# Patient Record
Sex: Female | Born: 1945 | Race: White | Hispanic: No | Marital: Married | Smoking: Never smoker
Health system: Southern US, Community
[De-identification: ages and names within clinical notes are randomized; demographics above are authoritative.]

## PROBLEM LIST (undated history)

## (undated) DIAGNOSIS — T7840XA Allergy, unspecified, initial encounter: Secondary | ICD-10-CM

## (undated) DIAGNOSIS — I10 Essential (primary) hypertension: Secondary | ICD-10-CM

## (undated) DIAGNOSIS — E669 Obesity, unspecified: Secondary | ICD-10-CM

## (undated) DIAGNOSIS — H9193 Unspecified hearing loss, bilateral: Secondary | ICD-10-CM

## (undated) DIAGNOSIS — E785 Hyperlipidemia, unspecified: Secondary | ICD-10-CM

## (undated) DIAGNOSIS — F419 Anxiety disorder, unspecified: Secondary | ICD-10-CM

## (undated) HISTORY — DX: Hyperlipidemia, unspecified: E78.5

## (undated) HISTORY — DX: Allergy, unspecified, initial encounter: T78.40XA

## (undated) HISTORY — DX: Essential (primary) hypertension: I10

## (undated) HISTORY — PX: FRACTURE SURGERY: SHX138

## (undated) HISTORY — DX: Anxiety disorder, unspecified: F41.9

## (undated) HISTORY — DX: Obesity, unspecified: E66.9

## (undated) HISTORY — DX: Unspecified hearing loss, bilateral: H91.93

## (undated) HISTORY — PX: TUBAL LIGATION: SHX77

---

## 1999-08-20 ENCOUNTER — Other Ambulatory Visit: Admission: RE | Admit: 1999-08-20 | Discharge: 1999-08-20 | Payer: Self-pay | Admitting: Internal Medicine

## 2000-08-15 ENCOUNTER — Other Ambulatory Visit: Admission: RE | Admit: 2000-08-15 | Discharge: 2000-08-15 | Payer: Self-pay | Admitting: Internal Medicine

## 2000-11-24 ENCOUNTER — Encounter: Payer: Self-pay | Admitting: Internal Medicine

## 2000-11-24 ENCOUNTER — Encounter: Admission: RE | Admit: 2000-11-24 | Discharge: 2000-11-24 | Payer: Self-pay | Admitting: Internal Medicine

## 2001-10-02 ENCOUNTER — Other Ambulatory Visit: Admission: RE | Admit: 2001-10-02 | Discharge: 2001-10-02 | Payer: Self-pay | Admitting: Internal Medicine

## 2002-11-16 ENCOUNTER — Other Ambulatory Visit: Admission: RE | Admit: 2002-11-16 | Discharge: 2002-11-16 | Payer: Self-pay | Admitting: Internal Medicine

## 2002-12-04 ENCOUNTER — Encounter: Admission: RE | Admit: 2002-12-04 | Discharge: 2003-03-04 | Payer: Self-pay | Admitting: Internal Medicine

## 2003-01-01 ENCOUNTER — Other Ambulatory Visit: Admission: RE | Admit: 2003-01-01 | Discharge: 2003-01-01 | Payer: Self-pay | Admitting: Internal Medicine

## 2003-05-16 ENCOUNTER — Encounter: Admission: RE | Admit: 2003-05-16 | Discharge: 2003-08-14 | Payer: Self-pay | Admitting: Internal Medicine

## 2003-10-04 ENCOUNTER — Other Ambulatory Visit: Admission: RE | Admit: 2003-10-04 | Discharge: 2003-10-04 | Payer: Self-pay | Admitting: Internal Medicine

## 2004-08-14 ENCOUNTER — Other Ambulatory Visit: Admission: RE | Admit: 2004-08-14 | Discharge: 2004-08-14 | Payer: Self-pay | Admitting: Internal Medicine

## 2005-08-16 ENCOUNTER — Other Ambulatory Visit: Admission: RE | Admit: 2005-08-16 | Discharge: 2005-08-16 | Payer: Self-pay | Admitting: Internal Medicine

## 2006-09-01 ENCOUNTER — Other Ambulatory Visit: Admission: RE | Admit: 2006-09-01 | Discharge: 2006-09-01 | Payer: Self-pay | Admitting: Internal Medicine

## 2007-11-09 ENCOUNTER — Other Ambulatory Visit: Admission: RE | Admit: 2007-11-09 | Discharge: 2007-11-09 | Payer: Self-pay | Admitting: Internal Medicine

## 2008-05-07 ENCOUNTER — Ambulatory Visit: Payer: Self-pay | Admitting: Internal Medicine

## 2008-05-27 ENCOUNTER — Ambulatory Visit: Payer: Self-pay | Admitting: Internal Medicine

## 2008-12-30 ENCOUNTER — Ambulatory Visit: Payer: Self-pay | Admitting: Internal Medicine

## 2009-07-01 ENCOUNTER — Ambulatory Visit: Payer: Self-pay | Admitting: Internal Medicine

## 2009-07-25 ENCOUNTER — Ambulatory Visit: Payer: Self-pay | Admitting: Internal Medicine

## 2010-01-01 ENCOUNTER — Ambulatory Visit: Payer: Self-pay | Admitting: Internal Medicine

## 2010-06-30 ENCOUNTER — Ambulatory Visit: Payer: Self-pay | Admitting: Internal Medicine

## 2010-07-02 ENCOUNTER — Encounter: Payer: Self-pay | Admitting: Internal Medicine

## 2010-07-02 ENCOUNTER — Ambulatory Visit (INDEPENDENT_AMBULATORY_CARE_PROVIDER_SITE_OTHER): Payer: Self-pay | Admitting: Internal Medicine

## 2010-07-02 ENCOUNTER — Telehealth: Payer: Self-pay

## 2010-07-02 DIAGNOSIS — E119 Type 2 diabetes mellitus without complications: Secondary | ICD-10-CM

## 2010-07-02 DIAGNOSIS — L299 Pruritus, unspecified: Secondary | ICD-10-CM

## 2010-07-02 DIAGNOSIS — E785 Hyperlipidemia, unspecified: Secondary | ICD-10-CM

## 2010-07-02 LAB — HEMOGLOBIN A1C
Hgb A1c MFr Bld: 6.3 % — ABNORMAL HIGH (ref <5.7)
Mean Plasma Glucose: 134 mg/dL — ABNORMAL HIGH (ref <117)

## 2010-07-02 LAB — HEPATIC FUNCTION PANEL
ALT: 12 U/L (ref 0–35)
AST: 15 U/L (ref 0–37)
Albumin: 4.7 g/dL (ref 3.5–5.2)
Alkaline Phosphatase: 92 U/L (ref 39–117)
Bilirubin, Direct: 0.1 mg/dL (ref 0.0–0.3)
Indirect Bilirubin: 0.5 mg/dL (ref 0.0–0.9)
Total Bilirubin: 0.6 mg/dL (ref 0.3–1.2)
Total Protein: 8 g/dL (ref 6.0–8.3)

## 2010-07-02 LAB — LIPID PANEL
Cholesterol: 161 mg/dL (ref 0–200)
HDL: 69 mg/dL (ref >39)
LDL Cholesterol: 72 mg/dL (ref 0–99)
Total CHOL/HDL Ratio: 2.3 Ratio
Triglycerides: 101 mg/dL (ref <150)
VLDL: 20 mg/dL (ref 0–40)

## 2010-07-02 MED ORDER — DESOXIMETASONE 0.25 % EX CREA: TOPICAL_CREAM | Freq: Two times a day (BID) | CUTANEOUS | Status: DC

## 2010-07-02 NOTE — Patient Instructions (Signed)
Lose weight, watch diet, increase exercise. No more than 1400 calories daily

## 2010-07-02 NOTE — Progress Notes (Signed)
  Subjective:    Patient ID: Terry Wilson, female    DOB: 1945/04/19, 65 y.o.   MRN: [id number]  HPI  65 year old W female with DM, anxiety, HTN, hearing loss, hyperlipidemia, allergic rhinitis for 6 month recheck.  Eye exam due in Sept by Dr. Randon Goldsmith office.  Allergies have been worse lately.  Anxious today and BP is again elevated.  Took BP meds this am.    Review of Systems  HENT: Positive for sneezing and postnasal drip.   Eyes: Positive for itching.  Respiratory: Positive for cough. Negative for shortness of breath.   Cardiovascular: Negative for chest pain.       Objective:   Physical Exam  Constitutional: She is oriented to person, place, and time. She appears well-nourished.  HENT:  Right Ear: External ear normal.  Left Ear: External ear normal.  Eyes: Conjunctivae are normal. Pupils are equal, round, and reactive to light. Right eye exhibits no discharge. Left eye exhibits no discharge. No scleral icterus.  Neck: Neck supple. No JVD present. No thyromegaly present.  Cardiovascular: Normal rate and regular rhythm.   Murmur heard. Pulmonary/Chest: No respiratory distress. She has no wheezes. She has no rales.  Lymphadenopathy:    She has no cervical adenopathy.  Neurological: She is alert and oriented to person, place, and time. She has normal reflexes.  Skin: Skin is warm and dry.  Psychiatric: She has a normal mood and affect.   Foot exam- pulses normal and no evidence of neuropathy. No Tinea pedis.  I-II/6 SEM not new. No edema lower extremity.  Boggy nasal mucosa.        Assessment & Plan:

## 2010-07-03 MED ORDER — DESOXIMETASONE 0.25 % EX CREA: TOPICAL_CREAM | Freq: Two times a day (BID) | CUTANEOUS | Status: AC

## 2010-07-03 NOTE — Telephone Encounter (Signed)
Medication reordered per MD.

## 2010-07-03 NOTE — Telephone Encounter (Signed)
Pt stated she had the name of the cream at home.  It is a cream she used in the past for dry skin.  She stated she would call Monday to let us know.

## 2010-08-03 ENCOUNTER — Other Ambulatory Visit: Payer: Self-pay | Admitting: Internal Medicine

## 2010-09-14 ENCOUNTER — Other Ambulatory Visit: Payer: Self-pay | Admitting: Internal Medicine

## 2010-10-21 ENCOUNTER — Other Ambulatory Visit: Payer: Self-pay | Admitting: Internal Medicine

## 2010-11-25 ENCOUNTER — Other Ambulatory Visit: Payer: Self-pay | Admitting: Internal Medicine

## 2010-12-10 ENCOUNTER — Other Ambulatory Visit: Payer: Self-pay | Admitting: Internal Medicine

## 2010-12-10 DIAGNOSIS — E785 Hyperlipidemia, unspecified: Secondary | ICD-10-CM

## 2010-12-21 ENCOUNTER — Encounter: Payer: Self-pay | Admitting: Internal Medicine

## 2011-01-05 ENCOUNTER — Other Ambulatory Visit (HOSPITAL_COMMUNITY)
Admission: RE | Admit: 2011-01-05 | Discharge: 2011-01-05 | Disposition: A | Discharge status: Home / Self Care | Payer: 59 | Source: Ambulatory Visit | Attending: Internal Medicine | Admitting: Internal Medicine

## 2011-01-05 ENCOUNTER — Encounter: Payer: Self-pay | Admitting: Internal Medicine

## 2011-01-05 ENCOUNTER — Ambulatory Visit (INDEPENDENT_AMBULATORY_CARE_PROVIDER_SITE_OTHER): Payer: 59 | Admitting: Internal Medicine

## 2011-01-05 DIAGNOSIS — E119 Type 2 diabetes mellitus without complications: Secondary | ICD-10-CM

## 2011-01-05 DIAGNOSIS — Z01419 Encounter for gynecological examination (general) (routine) without abnormal findings: Secondary | ICD-10-CM | POA: Insufficient documentation

## 2011-01-05 DIAGNOSIS — H9190 Unspecified hearing loss, unspecified ear: Secondary | ICD-10-CM

## 2011-01-05 DIAGNOSIS — I10 Essential (primary) hypertension: Secondary | ICD-10-CM

## 2011-01-05 DIAGNOSIS — J309 Allergic rhinitis, unspecified: Secondary | ICD-10-CM

## 2011-01-05 DIAGNOSIS — H9193 Unspecified hearing loss, bilateral: Secondary | ICD-10-CM

## 2011-01-05 DIAGNOSIS — F419 Anxiety disorder, unspecified: Secondary | ICD-10-CM

## 2011-01-05 DIAGNOSIS — Z0000 Encounter for general adult medical examination without abnormal findings: Secondary | ICD-10-CM

## 2011-01-05 DIAGNOSIS — Z23 Encounter for immunization: Secondary | ICD-10-CM

## 2011-01-05 DIAGNOSIS — Z124 Encounter for screening for malignant neoplasm of cervix: Secondary | ICD-10-CM

## 2011-01-05 DIAGNOSIS — E785 Hyperlipidemia, unspecified: Secondary | ICD-10-CM

## 2011-01-05 DIAGNOSIS — F411 Generalized anxiety disorder: Secondary | ICD-10-CM

## 2011-01-05 LAB — COMPREHENSIVE METABOLIC PANEL
ALT: 14 U/L (ref 0–35)
AST: 14 U/L (ref 0–37)
Alkaline Phosphatase: 90 U/L (ref 39–117)
Sodium: 140 mEq/L (ref 135–145)
Total Bilirubin: 0.6 mg/dL (ref 0.3–1.2)
Total Protein: 7.3 g/dL (ref 6.0–8.3)

## 2011-01-05 LAB — POCT URINALYSIS DIPSTICK
Bilirubin, UA: NEGATIVE
Blood, UA: NEGATIVE
Glucose, UA: NEGATIVE
Ketones, UA: NEGATIVE
Spec Grav, UA: 1.01
Urobilinogen, UA: NEGATIVE

## 2011-01-05 LAB — LIPID PANEL
Cholesterol: 148 mg/dL (ref 0–200)
HDL: 66 mg/dL (ref >39)
Total CHOL/HDL Ratio: 2.2 Ratio
Triglycerides: 86 mg/dL (ref <150)

## 2011-01-05 LAB — CBC WITH DIFFERENTIAL/PLATELET
Basophils Relative: 1 % (ref 0–1)
Eosinophils Absolute: 0.2 10*3/uL (ref 0.0–0.7)
MCH: 31.2 pg (ref 26.0–34.0)
MCHC: 32.1 g/dL (ref 30.0–36.0)
Neutro Abs: 3.5 10*3/uL (ref 1.7–7.7)
Neutrophils Relative %: 53 % (ref 43–77)
Platelets: 260 10*3/uL (ref 150–400)
RBC: 4.58 MIL/uL (ref 3.87–5.11)

## 2011-01-05 LAB — HEMOGLOBIN A1C: Hgb A1c MFr Bld: 6.5 % — ABNORMAL HIGH (ref <5.7)

## 2011-01-05 LAB — TSH: TSH: 1.45 u[IU]/mL (ref 0.350–4.500)

## 2011-01-05 MED ORDER — PNEUMOCOCCAL VAC POLYVALENT 25 MCG/0.5ML IJ INJ: 0.5000 mL | INJECTION | Freq: Once | INTRAMUSCULAR | Status: DC

## 2011-01-06 LAB — VITAMIN D 25 HYDROXY (VIT D DEFICIENCY, FRACTURES): Vit D, 25-Hydroxy: 44 ng/mL (ref 30–89)

## 2011-01-15 ENCOUNTER — Other Ambulatory Visit: Payer: Self-pay | Admitting: Internal Medicine

## 2011-01-16 ENCOUNTER — Other Ambulatory Visit: Payer: Self-pay | Admitting: Internal Medicine

## 2011-01-24 DIAGNOSIS — I10 Essential (primary) hypertension: Secondary | ICD-10-CM | POA: Insufficient documentation

## 2011-01-24 DIAGNOSIS — J309 Allergic rhinitis, unspecified: Secondary | ICD-10-CM | POA: Insufficient documentation

## 2011-01-24 DIAGNOSIS — E1169 Type 2 diabetes mellitus with other specified complication: Secondary | ICD-10-CM | POA: Insufficient documentation

## 2011-01-24 DIAGNOSIS — E119 Type 2 diabetes mellitus without complications: Secondary | ICD-10-CM | POA: Insufficient documentation

## 2011-01-24 DIAGNOSIS — E785 Hyperlipidemia, unspecified: Secondary | ICD-10-CM | POA: Insufficient documentation

## 2011-01-24 DIAGNOSIS — H9193 Unspecified hearing loss, bilateral: Secondary | ICD-10-CM | POA: Insufficient documentation

## 2011-01-24 DIAGNOSIS — F419 Anxiety disorder, unspecified: Secondary | ICD-10-CM | POA: Insufficient documentation

## 2011-01-24 NOTE — Patient Instructions (Signed)
Please return to audiologist and see about getting hearing aids. Continue same regimen for diabetic control i.e. diet and exercise. Continue same antihypertensive medications. Return in 6 months for lipid panel liver functions hemoglobin A1c in office visit. You have declined to receive influenza immunization today. You were given Pneumovax immunization.

## 2011-01-24 NOTE — Progress Notes (Signed)
  Subjective:    Patient ID: Terry Wilson, female    DOB: December 05, 1945, 65 y.o.   MRN: [id number]  HPI 65 year old white female with bilateral hearing loss for evaluation of medical problems including allergic rhinitis, anxiety, hypertension, obesity, adult onset diabetes mellitus, hyperlipidemia. History of bilateral tubal ligation in the 1980s. Had mammogram earlier this year. Refuses influenza immunization. Had Pneumovax immunization today. Had bone density study 2012. Diabetic eye exam per Dr. Hazle Quant on a yearly basis  Is intolerant of a number of medications. Cannot take Naprosyn, Clinoril, Macrobid, sulfa, is allergic to tetanus toxoid. Levaquin causes diarrhea and joint swelling.  History of fractured right ankle 1991, history of fractured right shoulder 2008, cervical polyp removed 2004.  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 Is married. One son. Completed one year of college. Does not smoke or consume alcohol. Is now retired.   Family history unremarkable     Review of Systems  Constitutional: Negative.   HENT: Negative.   Eyes: Negative.   Respiratory: Negative.   Cardiovascular: Negative.   Gastrointestinal: Negative.   Genitourinary: Negative.   Musculoskeletal: Negative.   Neurological: Negative.   Hematological: Negative.   Psychiatric/Behavioral: Negative.        Objective:   Physical Exam  Vitals reviewed. Constitutional: She is oriented to person, place, and time. She appears well-developed and well-nourished.  HENT:  Head: Normocephalic and atraumatic.  Right Ear: External ear normal.  Left Ear: External ear normal.  Mouth/Throat: Oropharynx is clear and moist. No oropharyngeal exudate.       Extremely hard of hearing  Eyes: Conjunctivae and EOM are normal. Pupils are equal, round, and reactive to light. No scleral icterus.  Neck: Neck supple. No JVD present. No thyromegaly present.    Cardiovascular: Normal rate, regular rhythm, normal heart sounds and intact distal pulses.   No murmur heard. Pulmonary/Chest: Effort normal and breath sounds normal. No respiratory distress.       Breasts normal female  Abdominal: Soft. Bowel sounds are normal.  Genitourinary: Vagina normal and uterus normal.       Deferred  Musculoskeletal: She exhibits no edema.       Diabetic foot exam negative  Lymphadenopathy:    She has no cervical adenopathy.  Neurological: She is alert and oriented to person, place, and time. She has normal reflexes. No cranial nerve deficit. Coordination normal.  Skin: Skin is warm and dry.  Psychiatric: She has a normal mood and affect. Her behavior is normal. Judgment and thought content normal.    Bimanual exam normal; vagina normal         Assessment & Plan:

## 2011-02-12 ENCOUNTER — Other Ambulatory Visit: Payer: Self-pay | Admitting: Internal Medicine

## 2011-03-09 ENCOUNTER — Other Ambulatory Visit: Payer: Self-pay | Admitting: Internal Medicine

## 2011-03-19 ENCOUNTER — Telehealth: Payer: Self-pay

## 2011-03-19 MED ORDER — CEPHALEXIN 500 MG PO CAPS: 500.0000 mg | ORAL_CAPSULE | Freq: Four times a day (QID) | ORAL | Status: AC

## 2011-03-19 NOTE — Telephone Encounter (Signed)
Pt called as office was closing due to bad weather. Call in Keflex 500 mg qid x 10 days.

## 2011-03-19 NOTE — Telephone Encounter (Signed)
Patient calls today at 215, having frequent urination and burning with urination. No fever or chills, no hematuria, just discomfort. States unable to take Cipro, Levaquin or Sulfa.

## 2011-03-20 DIAGNOSIS — N390 Urinary tract infection, site not specified: Secondary | ICD-10-CM

## 2011-04-20 ENCOUNTER — Other Ambulatory Visit: Payer: Self-pay | Admitting: Internal Medicine

## 2011-05-16 ENCOUNTER — Other Ambulatory Visit: Payer: Self-pay | Admitting: Internal Medicine

## 2011-05-17 ENCOUNTER — Other Ambulatory Visit: Payer: Self-pay | Admitting: Internal Medicine

## 2011-06-10 ENCOUNTER — Other Ambulatory Visit: Payer: Self-pay | Admitting: Internal Medicine

## 2011-07-06 ENCOUNTER — Encounter: Payer: Self-pay | Admitting: Internal Medicine

## 2011-07-06 ENCOUNTER — Ambulatory Visit (INDEPENDENT_AMBULATORY_CARE_PROVIDER_SITE_OTHER): Payer: 59 | Admitting: Internal Medicine

## 2011-07-06 VITALS — BP 140/78 | HR 84 | Temp 97.7°F | Wt 207.0 lb

## 2011-07-06 DIAGNOSIS — F419 Anxiety disorder, unspecified: Secondary | ICD-10-CM

## 2011-07-06 DIAGNOSIS — Z79899 Other long term (current) drug therapy: Secondary | ICD-10-CM

## 2011-07-06 DIAGNOSIS — F341 Dysthymic disorder: Secondary | ICD-10-CM

## 2011-07-06 DIAGNOSIS — E785 Hyperlipidemia, unspecified: Secondary | ICD-10-CM

## 2011-07-06 DIAGNOSIS — H9190 Unspecified hearing loss, unspecified ear: Secondary | ICD-10-CM

## 2011-07-06 DIAGNOSIS — E119 Type 2 diabetes mellitus without complications: Secondary | ICD-10-CM

## 2011-07-06 DIAGNOSIS — F329 Major depressive disorder, single episode, unspecified: Secondary | ICD-10-CM

## 2011-07-06 DIAGNOSIS — I10 Essential (primary) hypertension: Secondary | ICD-10-CM

## 2011-07-06 DIAGNOSIS — J309 Allergic rhinitis, unspecified: Secondary | ICD-10-CM

## 2011-07-06 DIAGNOSIS — F32A Depression, unspecified: Secondary | ICD-10-CM

## 2011-07-06 LAB — HEPATIC FUNCTION PANEL
ALT: 15 U/L (ref 0–35)
AST: 18 U/L (ref 0–37)
Alkaline Phosphatase: 87 U/L (ref 39–117)
Bilirubin, Direct: 0.1 mg/dL (ref 0.0–0.3)
Indirect Bilirubin: 0.6 mg/dL (ref 0.0–0.9)

## 2011-07-06 LAB — LIPID PANEL
Cholesterol: 173 mg/dL (ref 0–200)
Total CHOL/HDL Ratio: 2.4 Ratio

## 2011-07-06 LAB — HEMOGLOBIN A1C: Hgb A1c MFr Bld: 7.2 % — ABNORMAL HIGH (ref <5.7)

## 2011-07-06 NOTE — Progress Notes (Signed)
  Subjective:    Patient ID: Terry Wilson, female    DOB: [birth year], 66 y.o.   MRN: [id number]  HPI 66 year old white female with severe bilateral hearing loss in today for six-month recheck. History of diabetes mellitus, hypertension, hyperlipidemia, allergic rhinitis, anxiety. Is becoming depressed and anxious over husband's behavior since his father died. Husband is seeing Valinda Hoar for medication treatment but refuses to see someone for counseling. This Is concerning to pt. Have Suggested She Call Valinda Hoar and Leave Her a Message. Patient says she has not been watching her diet closely. She had Pneumovax immunization November 2012. Has never had colonoscopy. 3 Hemoccult cards given today. She does have a prescription to get Zostavax vaccine if drugstore. She used to take Celexa wants to go back on it. Fasting labs are drawn today and are pending. Has seen Landry Mellow, audiologist. Hearing aids are going to call $[phone] but she certainly needs him. I had to shout at her today. This is also bothering her husband.   Review of Systems     Objective:   Physical Exam chest is clear to auscultation; cardiac exam regular rate and rhythm; extremities without edema; diabetic foot exam no ulcers and pulses are normal        Assessment & Plan:  Diabetes mellitus  Anxiety depression  Hypertension  Hyperlipidemia  Obesity  Allergic rhinitis  Severe bilateral hearing loss  Plan: Return in 6 months for physical exam. Start Celexa 20 mg one half to one tablet daily #30 with 3 refills. Advise her to complete 3 Hemoccult cards and return to this office.

## 2011-07-06 NOTE — Patient Instructions (Signed)
Start Celexa as directed. Get hearing aids. Return in 6 months for physical exam. Take better care of yourself. Watch her diet.

## 2011-07-16 ENCOUNTER — Other Ambulatory Visit: Payer: Self-pay | Admitting: Internal Medicine

## 2011-10-28 ENCOUNTER — Other Ambulatory Visit: Payer: Self-pay | Admitting: Internal Medicine

## 2011-12-13 ENCOUNTER — Other Ambulatory Visit: Payer: Self-pay | Admitting: Internal Medicine

## 2011-12-24 ENCOUNTER — Other Ambulatory Visit: Payer: Self-pay | Admitting: Internal Medicine

## 2012-01-06 ENCOUNTER — Other Ambulatory Visit: Payer: 59 | Admitting: Internal Medicine

## 2012-01-06 ENCOUNTER — Ambulatory Visit: Payer: 59 | Admitting: Internal Medicine

## 2012-01-06 ENCOUNTER — Encounter: Payer: 59 | Admitting: Internal Medicine

## 2012-02-18 ENCOUNTER — Other Ambulatory Visit: Payer: Self-pay | Admitting: Internal Medicine

## 2012-03-02 ENCOUNTER — Ambulatory Visit (INDEPENDENT_AMBULATORY_CARE_PROVIDER_SITE_OTHER): Payer: Medicare Other | Admitting: Internal Medicine

## 2012-03-02 ENCOUNTER — Other Ambulatory Visit: Payer: Medicare Other | Admitting: Internal Medicine

## 2012-03-02 ENCOUNTER — Encounter: Payer: Self-pay | Admitting: Internal Medicine

## 2012-03-02 ENCOUNTER — Other Ambulatory Visit (HOSPITAL_COMMUNITY)
Admission: RE | Admit: 2012-03-02 | Discharge: 2012-03-02 | Disposition: A | Discharge status: Home / Self Care | Payer: Medicare Other | Source: Ambulatory Visit | Attending: Internal Medicine | Admitting: Internal Medicine

## 2012-03-02 VITALS — BP 138/76 | HR 92 | Temp 98.0°F | Ht 68.0 in | Wt 209.0 lb

## 2012-03-02 DIAGNOSIS — Z124 Encounter for screening for malignant neoplasm of cervix: Secondary | ICD-10-CM | POA: Insufficient documentation

## 2012-03-02 DIAGNOSIS — E669 Obesity, unspecified: Secondary | ICD-10-CM

## 2012-03-02 DIAGNOSIS — Z79899 Other long term (current) drug therapy: Secondary | ICD-10-CM

## 2012-03-02 DIAGNOSIS — E8881 Metabolic syndrome: Secondary | ICD-10-CM

## 2012-03-02 DIAGNOSIS — F411 Generalized anxiety disorder: Secondary | ICD-10-CM

## 2012-03-02 DIAGNOSIS — E119 Type 2 diabetes mellitus without complications: Secondary | ICD-10-CM

## 2012-03-02 DIAGNOSIS — H9193 Unspecified hearing loss, bilateral: Secondary | ICD-10-CM

## 2012-03-02 DIAGNOSIS — I10 Essential (primary) hypertension: Secondary | ICD-10-CM

## 2012-03-02 DIAGNOSIS — E785 Hyperlipidemia, unspecified: Secondary | ICD-10-CM

## 2012-03-02 DIAGNOSIS — H9190 Unspecified hearing loss, unspecified ear: Secondary | ICD-10-CM

## 2012-03-02 LAB — POCT URINALYSIS DIPSTICK
Ketones, UA: NEGATIVE
Leukocytes, UA: NEGATIVE
Protein, UA: NEGATIVE
pH, UA: 7

## 2012-03-02 LAB — CBC WITH DIFFERENTIAL/PLATELET
Basophils Absolute: 0 10*3/uL (ref 0.0–0.1)
HCT: 41.1 % (ref 36.0–46.0)
Lymphocytes Relative: 35 % (ref 12–46)
Neutro Abs: 3 10*3/uL (ref 1.7–7.7)
Platelets: 270 10*3/uL (ref 150–400)
RDW: 12.9 % (ref 11.5–15.5)
WBC: 5.6 10*3/uL (ref 4.0–10.5)

## 2012-03-02 LAB — HEMOGLOBIN A1C
Hgb A1c MFr Bld: 6.8 % — ABNORMAL HIGH (ref <5.7)
Mean Plasma Glucose: 148 mg/dL — ABNORMAL HIGH (ref <117)

## 2012-03-02 LAB — COMPREHENSIVE METABOLIC PANEL
ALT: 10 U/L (ref 0–35)
AST: 13 U/L (ref 0–37)
Albumin: 4.4 g/dL (ref 3.5–5.2)
Calcium: 9.9 mg/dL (ref 8.4–10.5)
Chloride: 100 mEq/L (ref 96–112)
Potassium: 4.1 mEq/L (ref 3.5–5.3)

## 2012-03-02 LAB — VITAMIN D 25 HYDROXY (VIT D DEFICIENCY, FRACTURES): Vit D, 25-Hydroxy: 35 ng/mL (ref 30–89)

## 2012-03-03 ENCOUNTER — Telehealth: Payer: Self-pay | Admitting: Internal Medicine

## 2012-03-03 NOTE — Telephone Encounter (Signed)
Bonita Quin called these in.

## 2012-05-20 ENCOUNTER — Other Ambulatory Visit: Payer: Self-pay | Admitting: Internal Medicine

## 2012-08-04 ENCOUNTER — Other Ambulatory Visit: Payer: Self-pay | Admitting: Internal Medicine

## 2012-08-26 ENCOUNTER — Encounter: Payer: Self-pay | Admitting: Internal Medicine

## 2012-08-26 DIAGNOSIS — E8881 Metabolic syndrome: Secondary | ICD-10-CM | POA: Insufficient documentation

## 2012-08-26 NOTE — Progress Notes (Signed)
Subjective:    Patient ID: Terry Wilson, female    DOB: [birth year], 67 y.o.   MRN: [id number]  HPI   67 year old white Female in today for Welcome to Medicare physical exam and evaluation of medical problems. History of allergic rhinitis, anxiety, hypertension, obesity, controlled type 2 diabetes mellitus, hyperlipidemia.  Refuses influenza immunization.  Pneumovax immunization given 2012.  Has diabetic eye exam by Dr. Hazle Quant in September yearly  Past medical history: History bilateral tubal ligation in the 1980s. Fractured right ankle 1991. History of fractured right shoulder 2008. Cervical polyp removed 2004.  Intolerant of a number of medications. Cannot take Naprosyn, Clinoril, Macrobid. Cannot take Sulfa. Is allergic to tetanus toxoid. Levaquin causes diarrhea and joint swelling.  Social history: Patient is married. She completed one year of college. Does not smoke or consume alcohol. Is now retired. 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  History bilateral hearing loss and now has bilateral hearing aids.  Family history: 2 sisters in good health. One son with history of alcoholism and diabetes mellitus.    Review of Systems  Constitutional: Positive for fatigue.  HENT:       History bilateral hearing loss  Eyes: Negative.   Respiratory: Negative.   Cardiovascular: Negative.   Gastrointestinal: Negative.   Endocrine: Negative.   Genitourinary: Negative.   Allergic/Immunologic: Positive for environmental allergies.  Neurological: Negative.   Psychiatric/Behavioral:       Anxiety       Objective:   Physical Exam  Vitals reviewed. Constitutional: She is oriented to person, place, and time. She appears well-developed and well-nourished. No distress.  HENT:  Head: Normocephalic and atraumatic.  Right Ear: External ear normal.  Left Ear: External ear normal.  Mouth/Throat: Oropharynx is clear and moist.  No oropharyngeal exudate.  Eyes: Conjunctivae and EOM are normal. Pupils are equal, round, and reactive to light. Right eye exhibits no discharge. Left eye exhibits no discharge. No scleral icterus.  Neck: Neck supple. No JVD present. No thyromegaly present.  Cardiovascular: Normal rate, regular rhythm, normal heart sounds and intact distal pulses.   No murmur heard. Pulmonary/Chest: Effort normal and breath sounds normal. No respiratory distress. She has no wheezes. She has no rales.  Breasts normal female  Abdominal: Soft. Bowel sounds are normal. She exhibits no distension and no mass. There is no tenderness. There is no rebound and no guarding.  Genitourinary: Vagina normal and uterus normal.  Pap taken  Musculoskeletal: Normal range of motion. She exhibits no edema.  Lymphadenopathy:    She has no cervical adenopathy.  Neurological: She is alert and oriented to person, place, and time. She has normal reflexes. She displays normal reflexes. No cranial nerve deficit.  Skin: Skin is warm and dry. No rash noted. She is not diaphoretic.  Psychiatric: She has a normal mood and affect. Her behavior is normal. Judgment and thought content normal.          Assessment & Plan:  Hypertension-controlled  Hyperlipidemia-controlled  Type 2 diabetes mellitus-controlled  Obesity-status diet exercise and weight loss  Metabolic syndrome  Bilateral hearing loss-now has hearing aids  Anxiety treated with Klonopin  Allergic rhinitis treated with Allegra and Flonase as well as Patanol ophthalmic drops  Plan: Return in 6 months for office visit panel liver functions hemoglobin A1c. Reminded about yearly eye exam with Dr. Hazle Quant. Needs annual mammogram. Refuses annual influenza immunization..   Subjective:   Patient presents for Medicare  Annual/Subsequent preventive examination.   Review Past Medical/Family/Social: See above   Risk Factors  Current exercise habits: Sedentary Dietary  issues discussed:  Low-fat low-carb diet.  Cardiac risk factors:  Depression Screen  (Note: if answer to either of the following is "Yes", a more complete depression screening is indicated)   Over the past two weeks, have you felt down, depressed or hopeless? No  Over the past two weeks, have you felt little interest or pleasure in doing things? No Have you lost interest or pleasure in daily life? No Do you often feel hopeless? No Do you cry easily over simple problems? No   Activities of Daily Living  In your present state of health, do you have any difficulty performing the following activities?:   Driving? No  Managing money? No  Feeding yourself? No  Getting from bed to chair? No  Climbing a flight of stairs? No  Preparing food and eating?: No  Bathing or showering? No  Getting dressed: No  Getting to the toilet? No  Using the toilet:No  Moving around from place to place: No  In the past year have you fallen or had a near fall?:No  Are you sexually active? seldom Do you have more than one partner? No   Hearing Difficulties: No  Do you often ask people to speak up or repeat themselves? Yes without hearing aids Do you experience ringing or noises in your ears? No  Do you have difficulty understanding soft or whispered voices? Yes without hearing aids Do you feel that you have a problem with memory? No Do you often misplace items? No    Home Safety:  Do you have a smoke alarm at your residence? Yes Do you have grab bars in the bathroom? Do you have throw rugs in your house?   Cognitive Testing  Alert? Yes Normal Appearance?Yes  Oriented to person? Yes Place? Yes  Time? Yes  Recall of three objects? Yes  Can perform simple calculations? Yes  Displays appropriate judgment?Yes  Can read the correct time from a watch face?Yes   List the Names of Other Physician/Practitioners you currently use:  See referral list for the physicians patient is currently seeing. Dr.  Hazle Quant    Review of Systems: See above   Objective:     General appearance: Appears stated age and mildly obese  Head: Normocephalic, without obvious abnormality, atraumatic  Eyes: conj clear, EOMi PEERLA  Ears: normal TM's and external ear canals both ears  Nose: Nares normal. Septum midline. Mucosa normal. No drainage or sinus tenderness.  Throat: lips, mucosa, and tongue normal; teeth and gums normal  Neck: no adenopathy, no carotid bruit, no JVD, supple, symmetrical, trachea midline and thyroid not enlarged, symmetric, no tenderness/mass/nodules  No CVA tenderness.  Lungs: clear to auscultation bilaterally  Breasts: normal appearance, no masses or tenderness. Heart: regular rate and rhythm, S1, S2 normal, no murmur, click, rub or gallop  Abdomen: soft, non-tender; bowel sounds normal; no masses, no organomegaly  Musculoskeletal: ROM normal in all joints, no crepitus, no deformity, Normal muscle strengthen. Back  is symmetric, no curvature. Skin: Skin color, texture, turgor normal. No rashes or lesions  Lymph nodes: Cervical, supraclavicular, and axillary nodes normal.  Neurologic: CN 2 -12 Normal, Normal symmetric reflexes. Normal coordination and gait  Psych: Alert & Oriented x 3, Mood appear stable.    Assessment:    Annual wellness medicare exam   Plan:    During the course of the visit the patient  was educated and counseled about appropriate screening and preventive services including:   Annual mammogram  Refuses influenza immunization  Annual diabetic eye exam     Patient Instructions (the written plan) was given to the patient.  Medicare Attestation  I have personally reviewed:  The patient's medical and social history  Their use of alcohol, tobacco or illicit drugs  Their current medications and supplements  The patient's functional ability including ADLs,fall risks, home safety risks, cognitive, and hearing and visual impairment  Diet and physical  activities  Evidence for depression or mood disorders  The patient's weight, height, BMI, and visual acuity have been recorded in the chart. I have made referrals, counseling, and provided education to the patient based on review of the above and I have provided the patient with a written personalized care plan for preventive services.

## 2012-08-26 NOTE — Patient Instructions (Addendum)
Continue same meds and return in  July.

## 2012-08-31 ENCOUNTER — Ambulatory Visit (INDEPENDENT_AMBULATORY_CARE_PROVIDER_SITE_OTHER): Payer: Medicare Other | Admitting: Internal Medicine

## 2012-08-31 ENCOUNTER — Encounter: Payer: Self-pay | Admitting: Internal Medicine

## 2012-08-31 VITALS — BP 130/80 | HR 80 | Temp 97.5°F | Wt 214.0 lb

## 2012-08-31 DIAGNOSIS — Z23 Encounter for immunization: Secondary | ICD-10-CM

## 2012-08-31 DIAGNOSIS — Z79899 Other long term (current) drug therapy: Secondary | ICD-10-CM

## 2012-08-31 DIAGNOSIS — E785 Hyperlipidemia, unspecified: Secondary | ICD-10-CM

## 2012-08-31 DIAGNOSIS — E119 Type 2 diabetes mellitus without complications: Secondary | ICD-10-CM

## 2012-08-31 LAB — HEPATIC FUNCTION PANEL
Alkaline Phosphatase: 93 U/L (ref 39–117)
Bilirubin, Direct: 0.1 mg/dL (ref 0.0–0.3)
Indirect Bilirubin: 0.4 mg/dL (ref 0.0–0.9)
Total Bilirubin: 0.5 mg/dL (ref 0.3–1.2)

## 2012-08-31 LAB — LIPID PANEL: LDL Cholesterol: 79 mg/dL (ref 0–99)

## 2012-08-31 MED ORDER — MELOXICAM 15 MG PO TABS: 15.0000 mg | ORAL_TABLET | Freq: Every day | ORAL | Status: DC

## 2012-08-31 MED ORDER — TERCONAZOLE 0.4 % VA CREA: TOPICAL_CREAM | VAGINAL | Status: DC

## 2012-08-31 MED ORDER — PIRBUTEROL ACETATE 200 MCG/INH IN AERB: INHALATION_SPRAY | RESPIRATORY_TRACT | Status: DC

## 2012-08-31 MED ORDER — ALBUTEROL SULFATE HFA 108 (90 BASE) MCG/ACT IN AERS: 2.0000 | INHALATION_SPRAY | Freq: Four times a day (QID) | RESPIRATORY_TRACT | Status: DC | PRN

## 2012-08-31 MED ORDER — CLONAZEPAM 0.5 MG PO TABS: 0.5000 mg | ORAL_TABLET | Freq: Two times a day (BID) | ORAL | Status: DC | PRN

## 2012-08-31 MED ORDER — PNEUMOCOCCAL VAC POLYVALENT 25 MCG/0.5ML IJ INJ: 0.5000 mL | INJECTION | INTRAMUSCULAR | Status: AC

## 2012-08-31 NOTE — Addendum Note (Signed)
Addended by: Judy Pimple on: 08/31/2012 12:44 PM   Modules accepted: Orders

## 2012-08-31 NOTE — Addendum Note (Signed)
Addended by: Judy Pimple on: 08/31/2012 11:58 AM   Modules accepted: Orders

## 2012-08-31 NOTE — Progress Notes (Signed)
  Subjective:    Patient ID: Terry Wilson, female    DOB: Oct 12, 1945, 67 y.o.   MRN: [id number]  HPI   67 year old white female with history of hypertension, anxiety, diabetes, allergic rhinitis in today for six-month recheck.  Family history: Father living at age 42 but unable to walk because of severe arthritis. Mother died with Picks disease. 2 sisters in good health.  Reminded about diabetic eye exam. Pneumovax update given today since she is now over 66.  Son is an alcoholic and has had some relapses recently. This is been stressful. Patient is retired.  Patient has never had a colonoscopy. She has declined this study so far.  Says she needs refill Maxair inhaler which is no longer made apparently. Drugstore may substitute another inhaler. Also at her request refill Terazol vaginal cream and Klonopin.  Has history of hearing loss    Review of Systems     Objective:   Physical Exam skin is warm and dry. Nodes none. Neck is supple without JVD thyromegaly or carotid bruits. Chest clear to auscultation. Cardiac exam regular rate and rhythm normal S1 and S2. Extremities without edema. Pulses are normal in feet. No diabetic foot lesions.        Assessment & Plan:  Anxiety-takes when necessary Klonopin sparingly  Type 2 diabetes mellitus-hemoglobin A1c pending  Hypertension-blood pressure under good control on current regimen  Allergic rhinitis-refill inhaler  Plan: Return in 6 months for physical examination.  Spent 25 minutes with patient about health issues, issues with husband and son

## 2012-08-31 NOTE — Patient Instructions (Addendum)
Continue same medications and return in 6 months 

## 2012-09-18 ENCOUNTER — Telehealth: Payer: Self-pay | Admitting: Internal Medicine

## 2012-09-18 NOTE — Telephone Encounter (Signed)
Called to say that her adult son was taken to [facility], admitted overnight and transferred to treatment facility in [facility] for substance abuse. He should be there 3-5 days for detox. Then  a program called [facility] in [place] has been recommended for him. Apparently he resumed drinking again recently and she was unaware of it. I have recommended Al-Anon for her.

## 2012-10-05 ENCOUNTER — Telehealth: Payer: Self-pay | Admitting: Internal Medicine

## 2012-10-05 NOTE — Telephone Encounter (Signed)
Left message on patient's cell [phone] advising that she would need to be seen or go to an Urgent Care r/t Dr. Beryle Quant instructions below.  No recent UTI on file.  Instructed patient to call me back if she wishes to make an appointment with [place].

## 2012-10-05 NOTE — Telephone Encounter (Signed)
She needs to be seen either here or at Urgent care. I have no record of a recent UTI.

## 2012-12-01 ENCOUNTER — Other Ambulatory Visit: Payer: Self-pay | Admitting: Internal Medicine

## 2013-03-02 ENCOUNTER — Other Ambulatory Visit: Payer: Self-pay | Admitting: Internal Medicine

## 2013-03-13 ENCOUNTER — Encounter: Payer: Self-pay | Admitting: Internal Medicine

## 2013-03-29 ENCOUNTER — Other Ambulatory Visit: Payer: Self-pay | Admitting: Internal Medicine

## 2013-03-30 NOTE — Telephone Encounter (Signed)
Refill x 6 months 

## 2013-04-21 ENCOUNTER — Other Ambulatory Visit: Payer: Self-pay | Admitting: Internal Medicine

## 2013-05-03 ENCOUNTER — Ambulatory Visit (INDEPENDENT_AMBULATORY_CARE_PROVIDER_SITE_OTHER): Payer: Medicare Other | Admitting: Internal Medicine

## 2013-05-03 ENCOUNTER — Other Ambulatory Visit: Payer: Medicare Other | Admitting: Internal Medicine

## 2013-05-03 ENCOUNTER — Encounter: Payer: Self-pay | Admitting: Internal Medicine

## 2013-05-03 VITALS — BP 146/68 | HR 88 | Temp 98.8°F | Ht 67.0 in | Wt 221.0 lb

## 2013-05-03 DIAGNOSIS — E669 Obesity, unspecified: Secondary | ICD-10-CM

## 2013-05-03 DIAGNOSIS — F4321 Adjustment disorder with depressed mood: Secondary | ICD-10-CM

## 2013-05-03 DIAGNOSIS — Z130 Encounter for screening for diseases of the blood and blood-forming organs and certain disorders involving the immune mechanism: Secondary | ICD-10-CM

## 2013-05-03 DIAGNOSIS — I10 Essential (primary) hypertension: Secondary | ICD-10-CM

## 2013-05-03 DIAGNOSIS — Z0000 Encounter for general adult medical examination without abnormal findings: Secondary | ICD-10-CM

## 2013-05-03 DIAGNOSIS — J309 Allergic rhinitis, unspecified: Secondary | ICD-10-CM

## 2013-05-03 DIAGNOSIS — E785 Hyperlipidemia, unspecified: Secondary | ICD-10-CM

## 2013-05-03 DIAGNOSIS — E119 Type 2 diabetes mellitus without complications: Secondary | ICD-10-CM

## 2013-05-03 DIAGNOSIS — Z79899 Other long term (current) drug therapy: Secondary | ICD-10-CM

## 2013-05-03 DIAGNOSIS — H9190 Unspecified hearing loss, unspecified ear: Secondary | ICD-10-CM

## 2013-05-03 DIAGNOSIS — Z13228 Encounter for screening for other metabolic disorders: Secondary | ICD-10-CM

## 2013-05-03 DIAGNOSIS — F4320 Adjustment disorder, unspecified: Secondary | ICD-10-CM

## 2013-05-03 DIAGNOSIS — H9193 Unspecified hearing loss, bilateral: Secondary | ICD-10-CM

## 2013-05-03 DIAGNOSIS — Z1329 Encounter for screening for other suspected endocrine disorder: Secondary | ICD-10-CM

## 2013-05-03 LAB — CBC WITH DIFFERENTIAL/PLATELET
BASOS ABS: 0.1 10*3/uL (ref 0.0–0.1)
BASOS PCT: 1 % (ref 0–1)
EOS ABS: 0.1 10*3/uL (ref 0.0–0.7)
EOS PCT: 2 % (ref 0–5)
HEMATOCRIT: 42.1 % (ref 36.0–46.0)
HEMOGLOBIN: 14.1 g/dL (ref 12.0–15.0)
Lymphocytes Relative: 37 % (ref 12–46)
Lymphs Abs: 2.4 10*3/uL (ref 0.7–4.0)
MCH: 30.5 pg (ref 26.0–34.0)
MCHC: 33.5 g/dL (ref 30.0–36.0)
MCV: 91.1 fL (ref 78.0–100.0)
MONO ABS: 0.5 10*3/uL (ref 0.1–1.0)
MONOS PCT: 8 % (ref 3–12)
NEUTROS ABS: 3.4 10*3/uL (ref 1.7–7.7)
Neutrophils Relative %: 52 % (ref 43–77)
Platelets: 287 10*3/uL (ref 150–400)
RBC: 4.62 MIL/uL (ref 3.87–5.11)
RDW: 13.4 % (ref 11.5–15.5)
WBC: 6.5 10*3/uL (ref 4.0–10.5)

## 2013-05-03 LAB — COMPREHENSIVE METABOLIC PANEL
ALBUMIN: 4.1 g/dL (ref 3.5–5.2)
ALK PHOS: 93 U/L (ref 39–117)
ALT: 15 U/L (ref 0–35)
AST: 14 U/L (ref 0–37)
BILIRUBIN TOTAL: 0.5 mg/dL (ref 0.2–1.2)
BUN: 15 mg/dL (ref 6–23)
CO2: 32 mEq/L (ref 19–32)
CREATININE: 0.62 mg/dL (ref 0.50–1.10)
Calcium: 9.7 mg/dL (ref 8.4–10.5)
Chloride: 98 mEq/L (ref 96–112)
GLUCOSE: 135 mg/dL — AB (ref 70–99)
POTASSIUM: 4.1 meq/L (ref 3.5–5.3)
Sodium: 136 mEq/L (ref 135–145)
Total Protein: 7.2 g/dL (ref 6.0–8.3)

## 2013-05-03 LAB — POCT URINALYSIS DIPSTICK
BILIRUBIN UA: NEGATIVE
Glucose, UA: NEGATIVE
KETONES UA: NEGATIVE
LEUKOCYTES UA: NEGATIVE
NITRITE UA: NEGATIVE
PH UA: 6.5
PROTEIN UA: NEGATIVE
RBC UA: NEGATIVE
Spec Grav, UA: 1.01
Urobilinogen, UA: NEGATIVE

## 2013-05-03 LAB — LIPID PANEL
CHOL/HDL RATIO: 2.9 ratio
Cholesterol: 190 mg/dL (ref 0–200)
HDL: 65 mg/dL (ref >39)
LDL CALC: 108 mg/dL — AB (ref 0–99)
TRIGLYCERIDES: 83 mg/dL (ref <150)
VLDL: 17 mg/dL (ref 0–40)

## 2013-05-03 LAB — HEMOGLOBIN A1C
Hgb A1c MFr Bld: 7.1 % — ABNORMAL HIGH (ref <5.7)
Mean Plasma Glucose: 157 mg/dL — ABNORMAL HIGH (ref <117)

## 2013-05-03 MED ORDER — METHYLPREDNISOLONE ACETATE 80 MG/ML IJ SUSP: 80.0000 mg | Freq: Once | INTRAMUSCULAR | Status: DC

## 2013-05-03 MED ORDER — METHYLPREDNISOLONE ACETATE 80 MG/ML IJ SUSP
80.0000 mg | Freq: Once | INTRAMUSCULAR | Status: AC
  Administered 2013-05-03: 80 mg via INTRAMUSCULAR

## 2013-05-03 NOTE — Patient Instructions (Signed)
Return in 6 months. Lab work is pending. Depo-Medrol 80 mg IM given today for allergy symptoms. Continue same medications. Try the diet exercise and lose weight.

## 2013-05-03 NOTE — Progress Notes (Signed)
Subjective:    Patient ID: Terry Wilson, female    DOB: 08-May-1945, 68 y.o.   MRN: [id number]  HPI  68 year old Female for health maintenance and evaluation of medical issues. History of DM, HTN, Hyperlipidemia, metabolic syndrome, obesity, hearing loss both ears and uses hearing aids, allergic rhinitis.  Pt grieving loss of father in December.   Family History: Father died recently at age 49 had lost ability to walk with history of back issues and arthritis. Was under Hospice care. Was complaining of unilateral leg pain- perhaps a DVT with PE. Mother had Pick's disease. 2 sisters in good health. One son with history of alcoholism and diabetes.  SHx: Patient is married. She completed one year of college. Does not smoke or consume alcohol. She is now retired. 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 One adult son.  Not sleeping well. Worried about getting Father's house cleaned out.  Past medical history: History of bilateral tubal ligation in the 1980s. Fractured right ankle 1991. History of fractured right shoulder 2008. Cervical polyp removed in 2004.  Refuses influenza immunization. Pneumovax immunization given in 2012.  Intolerant of a number of medications. Cannot take Naprosyn, Clinoril, Macrobid. Cannot take Sulfa. Is allergic to tetanus toxoid. Levaquin causes diarrhea and joint swelling.   Had eye exam by Dr. Hazle Quant Fall 2014.        Review of Systems  Constitutional: Negative.   HENT: Negative.   Eyes: Negative.   Respiratory: Negative.   Cardiovascular: Negative.   Gastrointestinal: Negative.   Endocrine: Negative.   Genitourinary: Negative.   Allergic/Immunologic: Negative.   Neurological: Negative.   Hematological: Negative.   Psychiatric/Behavioral: Negative.        Insomnia and anxiety over her father's death       Objective:   Physical Exam  Vitals reviewed. Constitutional: She is oriented to person,  place, and time. She appears well-developed and well-nourished. No distress.  HENT:  Head: Normocephalic and atraumatic.  Right Ear: External ear normal.  Left Ear: External ear normal.  Mouth/Throat: No oropharyngeal exudate.  Eyes: Conjunctivae and EOM are normal. Pupils are equal, round, and reactive to light. Right eye exhibits no discharge. Left eye exhibits no discharge. No scleral icterus.  Neck: Neck supple. No JVD present. No thyromegaly present.  Cardiovascular: Normal rate, regular rhythm, normal heart sounds and intact distal pulses.   No murmur heard. Pulmonary/Chest: Breath sounds normal. No respiratory distress. She has no wheezes. She has no rales. She exhibits no tenderness.  Breasts normal female  Abdominal: Soft. Bowel sounds are normal. She exhibits no distension and no mass. There is no tenderness. There is no rebound and no guarding.  Genitourinary:  Bimanual normal  Musculoskeletal: Normal range of motion. She exhibits no edema.  Neurological: She is alert and oriented to person, place, and time. She has normal reflexes.  Skin: Skin is warm and dry. No rash noted. She is not diaphoretic.  Psychiatric: She has a normal mood and affect. Her behavior is normal. Judgment and thought content normal.          Assessment & Plan:   Hearing loss- has hearing aids bilaterally  DM-well-controlled type 2  Hypertension-stable  Obesity  Grief reaction from loss of father  Hyperlipidemia-stable  Allergic rhinitis  History of anxiety  Plan: Says that cleaning out father's house exposed her to a lot of dust and she's had recent issues with allergic rhinitis. Given 80 mg  IM Depo-Medrol. Otherwise continue same medications return in 6 months.     Subjective:   Patient presents for Medicare Annual/Subsequent preventive examination.   Review Past Medical/Family/Social: see above   Risk Factors  Current exercise habits: walks and goes to gym sometimes to  walk Dietary issues discussed:   Cardiac risk factors:HTN, DM, Hyperlipidemia  Depression Screen  (Note: if answer to either of the following is "Yes", a more complete depression screening is indicated)   Over the past two weeks, have you felt down, depressed or hopeless? No  Over the past two weeks, have you felt little interest or pleasure in doing things? No Have you lost interest or pleasure in daily life? No Do you often feel hopeless? No Do you cry easily over simple problems? No   Activities of Daily Living  In your present state of health, do you have any difficulty performing the following activities?:   Driving? No  Managing money? No  Feeding yourself? No  Getting from bed to chair? No  Climbing a flight of stairs? No  Preparing food and eating?: No  Bathing or showering? No  Getting dressed: No  Getting to the toilet? No  Using the toilet:No  Moving around from place to place: No  In the past year have you fallen or had a near fall?:No  Are you sexually active? yes Do you have more than one partner? No   Hearing Difficulties: No  Do you often ask people to speak up or repeat themselves? Yes has bilateral hearing aids Do you experience ringing or noises in your ears? No  Do you have difficulty understanding soft or whispered voices? No  Do you feel that you have a problem with memory? No Do you often misplace items? No    Home Safety:  Do you have a smoke alarm at your residence? Yes Do you have grab bars in the bathroom?- Do you have throw rugs in your house?-   Cognitive Testing  Alert? Yes Normal Appearance?Yes  Oriented to person? Yes Place? Yes  Time? Yes  Recall of three objects? Yes  Can perform simple calculations? Yes  Displays appropriate judgment?Yes  Can read the correct time from a watch face?Yes   List the Names of Other Physician/Practitioners you currently use:  See referral list for the physicians patient is currently seeing.  [facility]    Review of Systems: See above   Objective:     General appearance: Appears stated age and obese  Head: Normocephalic, without obvious abnormality, atraumatic  Eyes: conj clear, EOMi PEERLA  Ears: normal TM's and external ear canals both ears  Nose: Nares normal. Septum midline. Mucosa normal. No drainage or sinus tenderness.  Throat: lips, mucosa, and tongue normal; teeth and gums normal  Neck: no adenopathy, no carotid bruit, no JVD, supple, symmetrical, trachea midline and thyroid not enlarged, symmetric, no tenderness/mass/nodules  No CVA tenderness.  Lungs: clear to auscultation bilaterally  Breasts: normal appearance, no masses or tenderness Heart: regular rate and rhythm, S1, S2 normal, no murmur, click, rub or gallop  Abdomen: soft, non-tender; bowel sounds normal; no masses, no organomegaly  Musculoskeletal: ROM normal in all joints, no crepitus, no deformity, Normal muscle strengthen. Back  is symmetric, no curvature. Skin: Skin color, texture, turgor normal. No rashes or lesions  Lymph nodes: Cervical, supraclavicular, and axillary nodes normal.  Neurologic: CN 2 -12 Normal, Normal symmetric reflexes. Normal coordination and gait  Psych: Alert & Oriented x 3, Mood  appear stable.    Assessment:    Annual wellness medicare exam   Plan:    During the course of the visit the patient was educated and counseled about appropriate screening and preventive services including:   Annual mammogram     Patient Instructions (the written plan) was given to the patient.  Medicare Attestation  I have personally reviewed:  The patient's medical and social history  Their use of alcohol, tobacco or illicit drugs  Their current medications and supplements  The patient's functional ability including ADLs,fall risks, home safety risks, cognitive, and hearing and visual impairment  Diet and physical activities  Evidence for depression or mood disorders  The  patient's weight, height, BMI, and visual acuity have been recorded in the chart. I have made referrals, counseling, and provided education to the patient based on review of the above and I have provided the patient with a written personalized care plan for preventive services.

## 2013-05-03 NOTE — Addendum Note (Signed)
Addended by: Judy PimpleEILAND, Cardin Nitschke M on: 05/03/2013 12:58 PM   Modules accepted: Orders

## 2013-05-04 LAB — VITAMIN D 25 HYDROXY (VIT D DEFICIENCY, FRACTURES): Vit D, 25-Hydroxy: 49 ng/mL (ref 30–89)

## 2013-05-04 LAB — MICROALBUMIN, URINE: MICROALB UR: 0.5 mg/dL (ref 0.00–1.89)

## 2013-05-04 LAB — TSH: TSH: 1.404 u[IU]/mL (ref 0.350–4.500)

## 2013-06-02 ENCOUNTER — Other Ambulatory Visit: Payer: Self-pay | Admitting: Internal Medicine

## 2013-08-08 ENCOUNTER — Other Ambulatory Visit: Payer: Self-pay | Admitting: Internal Medicine

## 2013-08-17 ENCOUNTER — Other Ambulatory Visit: Payer: Self-pay | Admitting: Internal Medicine

## 2013-10-20 ENCOUNTER — Other Ambulatory Visit: Payer: Self-pay | Admitting: Internal Medicine

## 2013-10-21 NOTE — Telephone Encounter (Signed)
Refill x 6 months 

## 2013-10-22 ENCOUNTER — Other Ambulatory Visit: Payer: Self-pay

## 2013-10-22 MED ORDER — CLONAZEPAM 0.5 MG PO TABS: 0.5000 mg | ORAL_TABLET | Freq: Two times a day (BID) | ORAL | Status: DC | PRN

## 2013-11-01 ENCOUNTER — Other Ambulatory Visit: Payer: Self-pay

## 2013-11-01 MED ORDER — FLUTICASONE PROPIONATE 50 MCG/ACT NA SUSP: NASAL | Status: DC

## 2013-11-01 MED ORDER — AMLODIPINE BESYLATE 10 MG PO TABS: ORAL_TABLET | ORAL | Status: DC

## 2013-11-06 ENCOUNTER — Ambulatory Visit (INDEPENDENT_AMBULATORY_CARE_PROVIDER_SITE_OTHER): Payer: Medicare Other | Admitting: Internal Medicine

## 2013-11-06 ENCOUNTER — Encounter: Payer: Self-pay | Admitting: Internal Medicine

## 2013-11-06 VITALS — BP 160/88 | HR 80 | Ht 67.0 in | Wt 224.0 lb

## 2013-11-06 DIAGNOSIS — I10 Essential (primary) hypertension: Secondary | ICD-10-CM

## 2013-11-06 DIAGNOSIS — E785 Hyperlipidemia, unspecified: Secondary | ICD-10-CM

## 2013-11-06 DIAGNOSIS — F4329 Adjustment disorder with other symptoms: Secondary | ICD-10-CM

## 2013-11-06 DIAGNOSIS — F329 Major depressive disorder, single episode, unspecified: Secondary | ICD-10-CM

## 2013-11-06 DIAGNOSIS — F411 Generalized anxiety disorder: Secondary | ICD-10-CM

## 2013-11-06 DIAGNOSIS — F4381 Prolonged grief disorder: Secondary | ICD-10-CM

## 2013-11-06 DIAGNOSIS — E119 Type 2 diabetes mellitus without complications: Secondary | ICD-10-CM

## 2013-11-06 LAB — HEMOGLOBIN A1C
Hgb A1c MFr Bld: 7.7 % — ABNORMAL HIGH (ref <5.7)
MEAN PLASMA GLUCOSE: 174 mg/dL — AB (ref <117)

## 2013-11-06 MED ORDER — AMOXICILLIN 500 MG PO CAPS: 500.0000 mg | ORAL_CAPSULE | Freq: Three times a day (TID) | ORAL | Status: DC

## 2013-11-06 MED ORDER — CLONAZEPAM 0.5 MG PO TABS: 0.5000 mg | ORAL_TABLET | Freq: Two times a day (BID) | ORAL | Status: DC | PRN

## 2013-11-06 NOTE — Progress Notes (Signed)
   Subjective:    Patient ID: Terry Wilson, female    DOB: 1945-10-21, 68 y.o.   MRN: [id number]  HPI  For a while did not take cholesterol meds but says has been compliant past 60 days. Mother past away 9 years ago with Pick's disease. Father dies 03/17/23. Has prolonged grief reaction. Had things to clean out of house. Tearful in office. Does not want to go to counseling. Father was not ambulatory died of natural causes perhaps CVA or PE. Maybe sepsis.  Hx HTN, hyperlipidemia, DM obesity, hearing loss, anxiety  C/o sore throat    Review of Systems     Objective:   Physical Exam Skin warm and dry. Nodes none. Neck supple. Chest clear. Cor: RRR Ext without edema. Pharynx is red without exudate.TMs clear. Spoke with patient at length regarding grief reaction           Assessment & Plan:  AODM HTN Anxiety Hyperlipidemia Prolonged grief reaction Pharyngitis  Plan: Amoxicillin 500 mg tid x 10 days Increase Klonopin to bid prn only been taking once daily. Declines Flu vaccine. RTC 6 months for CPE  25 minutes spent with patient

## 2013-11-06 NOTE — Patient Instructions (Addendum)
Take Klonopin twice a day as needed. Return in 6 months. Take amoxicillin for pharyngitis.

## 2013-11-07 LAB — LIPID PANEL
CHOLESTEROL: 169 mg/dL (ref 0–200)
HDL: 65 mg/dL (ref >39)
LDL Cholesterol: 82 mg/dL (ref 0–99)
TRIGLYCERIDES: 111 mg/dL (ref <150)
Total CHOL/HDL Ratio: 2.6 Ratio
VLDL: 22 mg/dL (ref 0–40)

## 2013-11-07 LAB — HEPATIC FUNCTION PANEL
ALBUMIN: 4.2 g/dL (ref 3.5–5.2)
ALK PHOS: 97 U/L (ref 39–117)
ALT: 21 U/L (ref 0–35)
AST: 16 U/L (ref 0–37)
BILIRUBIN INDIRECT: 0.6 mg/dL (ref 0.2–1.2)
Bilirubin, Direct: 0.1 mg/dL (ref 0.0–0.3)
Total Bilirubin: 0.7 mg/dL (ref 0.2–1.2)
Total Protein: 7.5 g/dL (ref 6.0–8.3)

## 2013-11-19 ENCOUNTER — Ambulatory Visit (INDEPENDENT_AMBULATORY_CARE_PROVIDER_SITE_OTHER): Payer: Medicare Other | Admitting: Internal Medicine

## 2013-11-19 ENCOUNTER — Telehealth: Payer: Self-pay | Admitting: Internal Medicine

## 2013-11-19 VITALS — BP 158/90 | HR 82 | Temp 98.0°F | Ht 67.0 in | Wt 228.0 lb

## 2013-11-19 DIAGNOSIS — I10 Essential (primary) hypertension: Secondary | ICD-10-CM

## 2013-11-19 MED ORDER — LOSARTAN POTASSIUM 100 MG PO TABS: 100.0000 mg | ORAL_TABLET | Freq: Every day | ORAL | Status: DC

## 2013-11-19 NOTE — Progress Notes (Signed)
Patient ID: Terry Wilson, female   DOB: Oct 14, 1945, 68 y.o.   MRN: [id number] Patient present for blood pressure check.  She has bought a machine and has been checking at home.  She did notice when she uses her right arm to check it is higher, she thinks it could be because it hurts her arm from previous injury.  Patient otherwise feels well today.

## 2013-11-19 NOTE — Addendum Note (Signed)
Addended by: Judd Gaudier on: 11/19/2013 11:01 AM   Modules accepted: Orders

## 2013-11-19 NOTE — Telephone Encounter (Signed)
Left message advising patient to start cozaar  and return to office Wednesday at 11 am.

## 2013-11-19 NOTE — Telephone Encounter (Signed)
Blood pressure remains elevated on amlodipine and HCTZ. Add Cozaar 100 mg daily and return Wednesday for blood pressure check.

## 2013-11-21 ENCOUNTER — Ambulatory Visit (INDEPENDENT_AMBULATORY_CARE_PROVIDER_SITE_OTHER): Payer: Medicare Other | Admitting: Internal Medicine

## 2013-11-21 ENCOUNTER — Encounter: Payer: Self-pay | Admitting: Internal Medicine

## 2013-11-21 VITALS — BP 140/80 | HR 84 | Wt 228.0 lb

## 2013-11-21 DIAGNOSIS — M25519 Pain in unspecified shoulder: Secondary | ICD-10-CM

## 2013-11-21 DIAGNOSIS — R030 Elevated blood-pressure reading, without diagnosis of hypertension: Secondary | ICD-10-CM

## 2013-11-21 DIAGNOSIS — I10 Essential (primary) hypertension: Secondary | ICD-10-CM

## 2013-11-21 DIAGNOSIS — R0989 Other specified symptoms and signs involving the circulatory and respiratory systems: Secondary | ICD-10-CM

## 2013-11-21 DIAGNOSIS — M25511 Pain in right shoulder: Secondary | ICD-10-CM

## 2013-11-21 DIAGNOSIS — F411 Generalized anxiety disorder: Secondary | ICD-10-CM

## 2013-11-21 NOTE — Progress Notes (Signed)
   Subjective:    Patient ID: Terry Wilson, female    DOB: 08/25/1945, 68 y.o.   MRN: [id number]  HPI Patient has labile hypertension. We started her on losartan 100 mg daily. In today for blood pressure check. Continues to be upset of her father's estate and his death. She is crying in the office today. Says in 2008, she fractured her right shoulder and was [place]. She's now having issues with her right shoulder once again probably due from overexertion and heavy lifting cleaning out her father's house. Suggested she find that orthopedist and see him again. She brings a home blood pressure cuff in today. Blood pressure with the cuff was elevated almost 20 points higher than here with a regular cuff. It does pop very tightly and sometimes causes bruising on her arm. Says she's sleeping better. Says a whole tablet of Klonopin makes her drowsy. She may take half tablet daily for anxiety. Already on HCTZ and amlodipine.    Review of Systems     Objective:   Physical Exam  Chest clear. Cardiac exam regular rate and rhythm. Extremities without edema. Spent 25 minutes with patient in the office talking with her about lifestyle, life stress, anxiety and hypertension.     Assessment & Plan:  Hypertension  Labile blood pressure  Anxiety  Right shoulder pain  Plan: Continue losartan 100 mg daily in addition to other blood pressure meds. Return in 4 weeks for office visit blood pressure check and basic metabolic panel. Seek orthopedic consultation regarding shoulder.

## 2013-11-21 NOTE — Patient Instructions (Signed)
Continue losartan, HCTZ, amlodipine. Return in 4 weeks. Take Klonopin as needed for anxiety. Try to diet exercise and take care of yourself. Consider counseling for stress.

## 2013-11-21 NOTE — Progress Notes (Signed)
Blood pressure also checked with patients monitor results as 162/92.

## 2013-11-27 ENCOUNTER — Telehealth: Payer: Self-pay

## 2013-11-27 NOTE — Telephone Encounter (Signed)
Left a message for call back.  Called patient regarding diabetic eye exam.  When patient calls back please ask:  Have you had a recent (2014-2015) eye exam?    Date of Exam?  Where?    

## 2013-11-28 NOTE — Telephone Encounter (Signed)
Pt returned call and was very upset that we call asking about her eye exam.  She felt that we were "drumming up business and I don't want to be bother with that.  Pt was reassured that was not the case, but she was still upset.  She stated that she goes to Dr. Alonza BogusBigsby office, but would not share when her last eye exam was and would appreciate if we did not call her back.  She then hung up.

## 2013-12-25 ENCOUNTER — Ambulatory Visit (INDEPENDENT_AMBULATORY_CARE_PROVIDER_SITE_OTHER): Payer: Medicare Other | Admitting: Internal Medicine

## 2013-12-25 ENCOUNTER — Encounter: Payer: Self-pay | Admitting: Internal Medicine

## 2013-12-25 VITALS — BP 152/82 | HR 88 | Temp 98.6°F | Wt 218.0 lb

## 2013-12-25 DIAGNOSIS — E669 Obesity, unspecified: Secondary | ICD-10-CM | POA: Diagnosis not present

## 2013-12-25 DIAGNOSIS — I10 Essential (primary) hypertension: Secondary | ICD-10-CM

## 2013-12-25 DIAGNOSIS — E119 Type 2 diabetes mellitus without complications: Secondary | ICD-10-CM

## 2013-12-25 DIAGNOSIS — F411 Generalized anxiety disorder: Secondary | ICD-10-CM | POA: Diagnosis not present

## 2013-12-25 NOTE — Patient Instructions (Addendum)
Return in March. Continue to monitor blood pressure at home. Call if persistently elevated.

## 2014-02-12 ENCOUNTER — Other Ambulatory Visit: Payer: Self-pay | Admitting: Internal Medicine

## 2014-02-16 ENCOUNTER — Other Ambulatory Visit: Payer: Self-pay | Admitting: Internal Medicine

## 2014-03-06 ENCOUNTER — Encounter: Payer: Self-pay | Admitting: Internal Medicine

## 2014-03-30 ENCOUNTER — Encounter: Payer: Self-pay | Admitting: Internal Medicine

## 2014-03-30 NOTE — Progress Notes (Signed)
   Subjective:    Patient ID: Terry Wilson, female    DOB: 05/29/1945, 69 y.o.   MRN: [id number]  HPI She was brought back in because of elevated blood pressure at last visit. Her blood pressure is once again elevated today. Hasn't taken medication until just recently before coming to office. This is likely the cause of her elevated blood pressure plus she has anxiety. May have an element of white coat hypertension.    Review of Systems     Objective:   Physical Exam  Skin warm and dry. Neck supple without JVD thyromegaly or carotid bruits. Chest clear. Cardiac exam regular rate and rhythm. Extremities without edema.      Assessment & Plan:  Elevated blood pressure  Anxiety  Possible white coat hypertension  Diabetes mellitus-controlled  Obesity  Plan: Patient is to monitor blood pressure at home. She says it's fine at home. She thinks she has an element of white coat hypertension. Return in March for physical examination.

## 2014-05-09 ENCOUNTER — Ambulatory Visit (INDEPENDENT_AMBULATORY_CARE_PROVIDER_SITE_OTHER): Payer: Medicare Other | Admitting: Internal Medicine

## 2014-05-09 ENCOUNTER — Encounter: Payer: Self-pay | Admitting: Internal Medicine

## 2014-05-09 VITALS — BP 130/84 | HR 83 | Temp 97.6°F | Ht 67.0 in | Wt 226.0 lb

## 2014-05-09 DIAGNOSIS — I10 Essential (primary) hypertension: Secondary | ICD-10-CM

## 2014-05-09 DIAGNOSIS — Z1329 Encounter for screening for other suspected endocrine disorder: Secondary | ICD-10-CM | POA: Diagnosis not present

## 2014-05-09 DIAGNOSIS — E8881 Metabolic syndrome: Secondary | ICD-10-CM

## 2014-05-09 DIAGNOSIS — Z13820 Encounter for screening for osteoporosis: Secondary | ICD-10-CM | POA: Diagnosis not present

## 2014-05-09 DIAGNOSIS — F411 Generalized anxiety disorder: Secondary | ICD-10-CM

## 2014-05-09 DIAGNOSIS — Z13220 Encounter for screening for lipoid disorders: Secondary | ICD-10-CM | POA: Diagnosis not present

## 2014-05-09 DIAGNOSIS — E785 Hyperlipidemia, unspecified: Secondary | ICD-10-CM

## 2014-05-09 DIAGNOSIS — Z0000 Encounter for general adult medical examination without abnormal findings: Secondary | ICD-10-CM | POA: Diagnosis not present

## 2014-05-09 DIAGNOSIS — M19011 Primary osteoarthritis, right shoulder: Secondary | ICD-10-CM

## 2014-05-09 DIAGNOSIS — E119 Type 2 diabetes mellitus without complications: Secondary | ICD-10-CM

## 2014-05-09 DIAGNOSIS — Z130 Encounter for screening for diseases of the blood and blood-forming organs and certain disorders involving the immune mechanism: Secondary | ICD-10-CM

## 2014-05-09 DIAGNOSIS — J309 Allergic rhinitis, unspecified: Secondary | ICD-10-CM

## 2014-05-09 DIAGNOSIS — M12819 Other specific arthropathies, not elsewhere classified, unspecified shoulder: Secondary | ICD-10-CM

## 2014-05-09 DIAGNOSIS — Z1321 Encounter for screening for nutritional disorder: Secondary | ICD-10-CM

## 2014-05-09 DIAGNOSIS — H9193 Unspecified hearing loss, bilateral: Secondary | ICD-10-CM

## 2014-05-09 DIAGNOSIS — E559 Vitamin D deficiency, unspecified: Secondary | ICD-10-CM

## 2014-05-09 DIAGNOSIS — E669 Obesity, unspecified: Secondary | ICD-10-CM

## 2014-05-09 LAB — POCT URINALYSIS DIPSTICK
Bilirubin, UA: NEGATIVE
Blood, UA: NEGATIVE
GLUCOSE UA: NEGATIVE
KETONES UA: NEGATIVE
Leukocytes, UA: NEGATIVE
NITRITE UA: NEGATIVE
Protein, UA: NEGATIVE
Spec Grav, UA: <=1.005
UROBILINOGEN UA: NEGATIVE
pH, UA: 7

## 2014-05-09 LAB — COMPLETE METABOLIC PANEL WITH GFR
ALK PHOS: 91 U/L (ref 39–117)
ALT: 18 U/L (ref 0–35)
AST: 16 U/L (ref 0–37)
Albumin: 4.3 g/dL (ref 3.5–5.2)
BILIRUBIN TOTAL: 0.6 mg/dL (ref 0.2–1.2)
BUN: 15 mg/dL (ref 6–23)
CO2: 29 meq/L (ref 19–32)
Calcium: 10 mg/dL (ref 8.4–10.5)
Chloride: 99 mEq/L (ref 96–112)
Creat: 0.57 mg/dL (ref 0.50–1.10)
GFR, Est Non African American: >89 mL/min
Glucose, Bld: 150 mg/dL — ABNORMAL HIGH (ref 70–99)
POTASSIUM: 5.2 meq/L (ref 3.5–5.3)
SODIUM: 138 meq/L (ref 135–145)
Total Protein: 7.3 g/dL (ref 6.0–8.3)

## 2014-05-09 LAB — LIPID PANEL
CHOLESTEROL: 169 mg/dL (ref 0–200)
HDL: 58 mg/dL (ref >=46)
LDL Cholesterol: 84 mg/dL (ref 0–99)
TRIGLYCERIDES: 133 mg/dL (ref <150)
Total CHOL/HDL Ratio: 2.9 Ratio
VLDL: 27 mg/dL (ref 0–40)

## 2014-05-09 NOTE — Progress Notes (Signed)
Subjective:    Patient ID: Terry Wilson, female    DOB: 12/07/1945, 69 y.o.   MRN: [id number]  HPI  69 year old White Female for health maintenance and evaluation of medical issues.History of HTN, hyperlipidemia, DM,  right shoulder arthropathy, obesity, anxiety, allergic rhinitis, metabolic syndrome, bilateral hearing loss requiring hearing aids.  Past medical history: Bilateral tubal ligation in the 1980s, fractured right ankle 1991. History of fractured right shoulder 2008. Cervical polyp removed in 2004. Recently saw Dr. Orson [place]Henderson in [place] who injected her right shoulder. Able to move right shoulder better after injection but still has some discomfort. She may need to see him again in. She may need to have physical therapy for? Frozen shoulder.  Patient refuses influenza immunization. Pneumovax immunization given in 2012.  Intolerant of Naprosyn, Clinoril, Macrobid. Cannot take Sulfa. Is allergic to tetanus toxoid. Levaquin causes diarrhea and joint swelling.  Has annual eye exam by Dr. Hazle Quantigby each Fall.  Social history: She is married. Completed one year college. Does not smoke or consume alcohol. 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 One adult son.  Family history: Father died at age 68. He lost the ability to walk with history of back issues and arthritis and was under hospice care. He was complaining of unilateral leg pain prior to his demise and perhaps had a DVT or PE. Mother died with Pick's disease. 2 sisters in good health. One son with history of alcoholism and diabetes.    Review of Systems  Constitutional: Negative.   Eyes: Negative.   Gastrointestinal: Negative.   Musculoskeletal: Positive for arthralgias.       Right shoulder, both knees  Allergic/Immunologic: Positive for environmental allergies.  Hematological: Negative.   Psychiatric/Behavioral:       Anxiety       Objective:   Physical Exam    Constitutional: She is oriented to person, place, and time. She appears well-developed and well-nourished. No distress.  HENT:  Head: Normocephalic and atraumatic.  Right Ear: External ear normal.  Left Ear: External ear normal.  Mouth/Throat: Oropharynx is clear and moist. No oropharyngeal exudate.  Very hard of hearing without hearing aids  Eyes: Conjunctivae are normal. Pupils are equal, round, and reactive to light. Right eye exhibits no discharge. Left eye exhibits no discharge. No scleral icterus.  Neck: Neck supple. No JVD present. No thyromegaly present.  Cardiovascular: Normal rate, regular rhythm, normal heart sounds and intact distal pulses.   Pulmonary/Chest: Breath sounds normal. No respiratory distress. She has no wheezes. She has no rales.  Breasts normal female  Abdominal: Bowel sounds are normal. She exhibits no distension and no mass. There is no rebound and no guarding.  Genitourinary:  Pap 2014. Bimanual normal  Musculoskeletal: She exhibits no edema.  Slight pain with extending right arm up overhead.  Lymphadenopathy:    She has no cervical adenopathy.  Neurological: She is alert and oriented to person, place, and time. She has normal reflexes. No cranial nerve deficit. Coordination normal.  Skin: Skin is warm and dry. No rash noted. She is not diaphoretic.  Psychiatric: She has a normal mood and affect. Her behavior is normal. Judgment and thought content normal.  Vitals reviewed.          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 May need to see Dr. Orson [place]Henderson in [place] once again  Hypertension-blood pressure stable today-all medication  Anxiety-has antianxiety medication  Bilateral hearing loss-has bilateral hearing aids  Metabolic syndrome  Obesity-encouraged diet exercise and weight loss  Controlled type 2 diabetes-hemoglobin A1c 7.6%  Hyperlipidemia-on statin medication  Allergic rhinitis  Vitamin D  deficiency-recommend 2000 units Vitamin D3 daily   Plan: See above regarding right shoulder issue. Encourage diet exercise and weight loss. Patient is basically sedentary. Return in 6 months. Annual mammogram. Bone density study ordered.         Subjective:   Patient presents for Medicare Annual/Subsequent preventive examination.  Review Past Medical/Family/Social: see above   Risk Factors  Current exercise habits: sedentary  Dietary issues discussed: low fat low carb  Cardiac risk factors: HTN, hyperlipidemia DM   Depression Screen  (Note: if answer to either of the following is "Yes", a more complete depression screening is indicated)   Over the past two weeks, have you felt down, depressed or hopeless? No  Over the past two weeks, have you felt little interest or pleasure in doing things? No Have you lost interest or pleasure in daily life? No Do you often feel hopeless? No Do you cry easily over simple problems? No   Activities of Daily Living  In your present state of health, do you have any difficulty performing the following activities?:   Driving? No  Managing money? No  Feeding yourself? No  Getting from bed to chair? No  Climbing a flight of stairs? No  Preparing food and eating?: No  Bathing or showering? No  Getting dressed: No  Getting to the toilet? No  Using the toilet:No  Moving around from place to place: No  In the past year have you fallen or had a near fall?:No  Are you sexually active? yes Do you have more than one partner? No   Hearing Difficulties: No  Do you often ask people to speak up or repeat themselves  Yes- has hearing aids Do you experience ringing or noises in your ears? yes Do you have difficulty understanding soft or whispered voices?  yes Do you feel that you have a problem with memory? no Do you often misplace items?  no   Home Safety:  Do you have a smoke alarm at your residence? Yes Do you have grab bars in the bathroom?  no Do you have throw rugs in your house? no   Cognitive Testing  Alert? Yes Normal Appearance?Yes  Oriented to person? Yes Place? Yes  Time? Yes  Recall of three objects? Yes  Can perform simple calculations? Yes  Displays appropriate judgment?Yes  Can read the correct time from a watch face?Yes   List the Names of Other Physician/Practitioners you currently use:  See referral list for the physicians patient is currently seeing.  See Dr. Orson Aloe orthopedist for shoulder   Review of Systems: see above   Objective:     General appearance: Appears stated age and obese  Head: Normocephalic, without obvious abnormality, atraumatic  Eyes: conj clear, EOMi PEERLA  Ears: normal TM's and external ear canals both ears  Nose: Nares normal. Septum midline. Mucosa normal. No drainage or sinus tenderness.  Throat: lips, mucosa, and tongue normal; teeth and gums normal  Neck: no adenopathy, no carotid bruit, no JVD, supple, symmetrical, trachea midline and thyroid not enlarged, symmetric, no tenderness/mass/nodules  No CVA tenderness.  Lungs: clear to auscultation bilaterally  Breasts: normal appearance, no masses or tenderness. Heart: regular rate and rhythm, S1, S2 normal, no murmur, click, rub or gallop  Abdomen: soft, non-tender; bowel sounds normal; no masses, no organomegaly  Musculoskeletal: ROM normal  in all joints, no crepitus, no deformity, Normal muscle strengthen. Back  is symmetric, no curvature. Skin: Skin color, texture, turgor normal. No rashes or lesions  Lymph nodes: Cervical, supraclavicular, and axillary nodes normal.  Neurologic: CN 2 -12 Normal, Normal symmetric reflexes. Normal coordination and gait  Psych: Alert & Oriented x 3, Mood appear stable.    Assessment:    Annual wellness medicare exam   Plan:    During the course of the visit the patient was educated and counseled about appropriate screening and preventive services including:  Annual eye exam-  Fall 2015 Mammogram Declines colonoscopy- given 3 hemoccult cards      Patient Instructions (the written plan) was given to the patient.  Medicare Attestation  I have personally reviewed:  The patient's medical and social history  Their use of alcohol, tobacco or illicit drugs  Their current medications and supplements  The patient's functional ability including ADLs,fall risks, home safety risks, cognitive, and hearing and visual impairment  Diet and physical activities  Evidence for depression or mood disorders  The patient's weight, height, BMI, and visual acuity have been recorded in the chart. I have made referrals, counseling, and provided education to the patient based on review of the above and I have provided the patient with a written personalized care plan for preventive services.

## 2014-05-10 LAB — CBC WITH DIFFERENTIAL/PLATELET
BASOS ABS: 0 10*3/uL (ref 0.0–0.1)
Basophils Relative: 0 % (ref 0–1)
EOS PCT: 3 % (ref 0–5)
Eosinophils Absolute: 0.2 10*3/uL (ref 0.0–0.7)
HCT: 44.6 % (ref 36.0–46.0)
Hemoglobin: 14.3 g/dL (ref 12.0–15.0)
Lymphocytes Relative: 36 % (ref 12–46)
Lymphs Abs: 2.3 10*3/uL (ref 0.7–4.0)
MCH: 30.6 pg (ref 26.0–34.0)
MCHC: 32.1 g/dL (ref 30.0–36.0)
MCV: 95.5 fL (ref 78.0–100.0)
MPV: 10.4 fL (ref 8.6–12.4)
Monocytes Absolute: 0.4 10*3/uL (ref 0.1–1.0)
Monocytes Relative: 6 % (ref 3–12)
NEUTROS PCT: 55 % (ref 43–77)
Neutro Abs: 3.6 10*3/uL (ref 1.7–7.7)
Platelets: 281 10*3/uL (ref 150–400)
RBC: 4.67 MIL/uL (ref 3.87–5.11)
RDW: 13.1 % (ref 11.5–15.5)
WBC: 6.5 10*3/uL (ref 4.0–10.5)

## 2014-05-10 LAB — HEMOGLOBIN A1C
Hgb A1c MFr Bld: 7.6 % — ABNORMAL HIGH (ref <5.7)
Mean Plasma Glucose: 171 mg/dL — ABNORMAL HIGH (ref <117)

## 2014-05-10 LAB — TSH: TSH: 1.388 u[IU]/mL (ref 0.350–4.500)

## 2014-05-10 LAB — MICROALBUMIN / CREATININE URINE RATIO
Creatinine, Urine: 23.3 mg/dL
Microalb, Ur: <0.2 mg/dL (ref <2.0)

## 2014-05-10 LAB — VITAMIN D 25 HYDROXY (VIT D DEFICIENCY, FRACTURES): Vit D, 25-Hydroxy: 24 ng/mL — ABNORMAL LOW (ref 30–100)

## 2014-05-12 NOTE — Patient Instructions (Signed)
Encouraged diet exercise and weight loss. May need to see orthopedist regarding right shoulder arthropathy once again. Continue same medications. Return in 6 months. Take 2000 units vitamin D 3 daily

## 2014-06-21 ENCOUNTER — Other Ambulatory Visit: Payer: Self-pay | Admitting: Internal Medicine

## 2014-07-05 ENCOUNTER — Other Ambulatory Visit: Payer: Self-pay | Admitting: Internal Medicine

## 2014-07-05 NOTE — Telephone Encounter (Signed)
Refilled Klonopin.

## 2014-07-05 NOTE — Telephone Encounter (Signed)
Refill x 6 months 

## 2014-10-21 ENCOUNTER — Other Ambulatory Visit: Payer: Self-pay | Admitting: Internal Medicine

## 2014-11-05 ENCOUNTER — Encounter: Payer: Self-pay | Admitting: Internal Medicine

## 2014-11-05 ENCOUNTER — Ambulatory Visit (INDEPENDENT_AMBULATORY_CARE_PROVIDER_SITE_OTHER): Payer: Medicare Other | Admitting: Internal Medicine

## 2014-11-05 VITALS — BP 134/84 | HR 86 | Temp 97.9°F | Ht 67.0 in | Wt 223.0 lb

## 2014-11-05 DIAGNOSIS — F411 Generalized anxiety disorder: Secondary | ICD-10-CM

## 2014-11-05 DIAGNOSIS — J302 Other seasonal allergic rhinitis: Secondary | ICD-10-CM | POA: Diagnosis not present

## 2014-11-05 DIAGNOSIS — E119 Type 2 diabetes mellitus without complications: Secondary | ICD-10-CM | POA: Diagnosis not present

## 2014-11-05 DIAGNOSIS — E785 Hyperlipidemia, unspecified: Secondary | ICD-10-CM

## 2014-11-05 DIAGNOSIS — I10 Essential (primary) hypertension: Secondary | ICD-10-CM

## 2014-11-05 DIAGNOSIS — Z79899 Other long term (current) drug therapy: Secondary | ICD-10-CM

## 2014-11-05 LAB — HEPATIC FUNCTION PANEL
ALT: 17 U/L (ref 6–29)
AST: 15 U/L (ref 10–35)
Albumin: 4.5 g/dL (ref 3.6–5.1)
Alkaline Phosphatase: 93 U/L (ref 33–130)
Bilirubin, Direct: 0.2 mg/dL (ref <=0.2)
Indirect Bilirubin: 0.5 mg/dL (ref 0.2–1.2)
Total Bilirubin: 0.7 mg/dL (ref 0.2–1.2)
Total Protein: 7.6 g/dL (ref 6.1–8.1)

## 2014-11-05 LAB — LIPID PANEL
Cholesterol: 189 mg/dL (ref 125–200)
HDL: 62 mg/dL (ref >=46)
LDL Cholesterol: 103 mg/dL (ref <130)
Total CHOL/HDL Ratio: 3 Ratio (ref <=5.0)
Triglycerides: 118 mg/dL (ref <150)
VLDL: 24 mg/dL (ref <30)

## 2014-11-05 LAB — HEMOGLOBIN A1C
HEMOGLOBIN A1C: 7.9 % — AB (ref <5.7)
Mean Plasma Glucose: 180 mg/dL — ABNORMAL HIGH (ref <117)

## 2014-11-05 MED ORDER — AMLODIPINE BESYLATE 10 MG PO TABS: ORAL_TABLET | ORAL | Status: DC

## 2014-11-05 MED ORDER — FLUTICASONE PROPIONATE 50 MCG/ACT NA SUSP: NASAL | Status: DC

## 2014-11-05 MED ORDER — LOSARTAN POTASSIUM 100 MG PO TABS: ORAL_TABLET | ORAL | Status: DC

## 2014-11-05 MED ORDER — ALBUTEROL SULFATE HFA 108 (90 BASE) MCG/ACT IN AERS: INHALATION_SPRAY | RESPIRATORY_TRACT | Status: DC

## 2014-11-05 NOTE — Progress Notes (Signed)
   Subjective:    Patient ID: Terry Wilson, female    DOB: Aug 21, 1945, 69 y.o.   MRN: [id number]  HPI  69 year old Female for 6 month recheck. Declines Prevnar because of previous local reaction to Pneumovax. Flu vaccine declined today. Says she's not checking Accu-Cheks on a regular basis. History of metabolic syndrome, allergic rhinitis, essential hypertension, anxiety, hyperlipidemia, controlled type 2 diabetes, bilateral hearing loss. She does have hearing aids but today seemed not to be wearing them. Probably not exercising as much as she should. Medications reviewed.  Fasting labs drawn and are pending.  To have mammogram in the near future. Order placed as well as order for bone density study.  Reminded about annual diabetic eye exam.    Review of Systems as above     Objective:   Physical Exam  Constitutional: She is oriented to person, place, and time. She appears well-developed and well-nourished. No distress.  HENT:  Head: Normocephalic and atraumatic.  Right Ear: External ear normal.  Left Ear: External ear normal.  Mouth/Throat: Oropharynx is clear and moist. No oropharyngeal exudate.  Eyes: Conjunctivae and EOM are normal. Pupils are equal, round, and reactive to light. Right eye exhibits no discharge. Left eye exhibits no discharge. No scleral icterus.  Neck: Neck supple. No JVD present. No thyromegaly present.  Cardiovascular: Normal rate, regular rhythm, normal heart sounds and intact distal pulses.   No murmur heard. Pulmonary/Chest: Effort normal and breath sounds normal. No respiratory distress. She has no wheezes.  Musculoskeletal: Normal range of motion. She exhibits no edema.  Lymphadenopathy:    She has no cervical adenopathy.  Neurological: She is alert and oriented to person, place, and time. She has normal reflexes. No cranial nerve deficit. Coordination normal.  Skin: Skin is warm and dry. No rash noted. She is not diaphoretic.  Psychiatric: She has a  normal mood and affect. Her behavior is normal. Judgment and thought content normal.  Vitals reviewed.         Assessment & Plan:  Obesity-encouraged diet exercise and weight loss  Metabolic syndrome  Type 2 diabetes mellitus-worrisome that she's not checking Accu-Cheks on a regular basis. Do not believe she is dieting and exercising as she should. Hemoglobin A1c 7.9% which is elevated from previous result. Encourage strict diet exercise and weight loss.  Essential hypertension-has element of lability. Blood pressure improved after rest  Bilateral hearing loss-needs to wear hearing aids on a regular basis  History of right shoulder arthropathy-improved  Health maintenance-declines Prevnar due to previous local reaction with Pneumovax 23. Declines flu vaccine.  Generalized anxiety disorder-stable on current medications

## 2014-11-05 NOTE — Patient Instructions (Addendum)
Continue same medications. Return in 6 months for physical exam. Hemoglobin A1c is elevated at 7.9%. Need to be strict about diet exercise and weight loss. Flu and Prevnar vaccines declined. Reminded about annual diabetic eye exam. Reminded about mammogram.

## 2014-11-15 ENCOUNTER — Other Ambulatory Visit: Payer: Self-pay | Admitting: Internal Medicine

## 2014-11-15 NOTE — Telephone Encounter (Signed)
Refill x one year °

## 2014-11-19 ENCOUNTER — Other Ambulatory Visit: Payer: Self-pay | Admitting: Internal Medicine

## 2015-02-17 ENCOUNTER — Other Ambulatory Visit: Payer: Self-pay | Admitting: Internal Medicine

## 2015-02-19 ENCOUNTER — Other Ambulatory Visit: Payer: Self-pay | Admitting: Internal Medicine

## 2015-02-19 NOTE — Telephone Encounter (Signed)
Refill x 6 months 

## 2015-02-20 ENCOUNTER — Other Ambulatory Visit: Payer: Self-pay | Admitting: Internal Medicine

## 2015-02-20 ENCOUNTER — Ambulatory Visit (INDEPENDENT_AMBULATORY_CARE_PROVIDER_SITE_OTHER): Payer: Medicare Other | Admitting: Internal Medicine

## 2015-02-20 ENCOUNTER — Encounter: Payer: Self-pay | Admitting: Internal Medicine

## 2015-02-20 VITALS — BP 134/78 | HR 80 | Temp 98.4°F | Resp 20 | Ht 67.0 in | Wt 212.0 lb

## 2015-02-20 DIAGNOSIS — M5441 Lumbago with sciatica, right side: Secondary | ICD-10-CM | POA: Diagnosis not present

## 2015-02-20 DIAGNOSIS — J069 Acute upper respiratory infection, unspecified: Secondary | ICD-10-CM

## 2015-02-20 DIAGNOSIS — H9193 Unspecified hearing loss, bilateral: Secondary | ICD-10-CM | POA: Diagnosis not present

## 2015-02-20 MED ORDER — HYDROCODONE-HOMATROPINE 5-1.5 MG/5ML PO SYRP: 5.0000 mL | ORAL_SOLUTION | Freq: Three times a day (TID) | ORAL | Status: DC | PRN

## 2015-02-20 MED ORDER — AMOXICILLIN 500 MG PO CAPS: 500.0000 mg | ORAL_CAPSULE | Freq: Three times a day (TID) | ORAL | Status: DC

## 2015-02-20 MED ORDER — METHYLPREDNISOLONE ACETATE 80 MG/ML IJ SUSP
80.0000 mg | Freq: Once | INTRAMUSCULAR | Status: AC
  Administered 2015-02-20: 80 mg via INTRAMUSCULAR

## 2015-02-20 NOTE — Telephone Encounter (Signed)
Refill x 6 months 

## 2015-02-20 NOTE — Telephone Encounter (Signed)
Phoned to pharmacy 

## 2015-02-21 NOTE — Progress Notes (Signed)
   Subjective:    Patient ID: Terry Wilson, female    DOB: 06/29/1945, 69 y.o.   MRN: [id number]  HPI Patient in today complaining of sore throat and URI symptoms. Also having right hip pain radiating into right leg. She is hard of hearing and has hearing aids but sometimes doesn't wear them. Sore throat just had onset today. Back pain has been present for several days. History of controlled type 2 diabetes mellitus.    Review of Systems     Objective:   Physical Exam Straight leg raising is slightly positive on the right negative on the left. Muscle strength is normal in both lower extremities as are deep tendon reflexes. Pharynx is injected without exudate. TMs slightly full bilaterally. Neck is supple without adenopathy. Chest clear to auscultation.       Assessment & Plan:  Acute URI  Hearing loss  Right sciatica  Plan: Meloxicam 15 mg daily. Hycodan 1 teaspoon by mouth every 8 hours when necessary cough. Amoxicillin 500 mg 3 times daily for 10 days. If sciatica symptoms do not improve in the next 7-10 days refer to physical therapy. Depo-Medrol 80 mg IM given.

## 2015-02-21 NOTE — Patient Instructions (Addendum)
Take amoxicillin 500 mg 3 times daily for 10 days respiratory infection. Hycodan 1 teaspoon by mouth every 8 hours when necessary cough. Continue meloxicam. If sciatica symptoms do not improve, refer to physical therapy. Depo-Medrol 80 mg IM given. This should help with respiratory congestion and pain in hip

## 2015-02-21 NOTE — Telephone Encounter (Signed)
This was phoned to pharmacy on 02/20/2015

## 2015-03-12 ENCOUNTER — Telehealth: Payer: Self-pay

## 2015-03-12 NOTE — Telephone Encounter (Signed)
I attempted to contact patient to discuss non-compliance with her medication per medicare and [facility] correspondence. She has not been taking the crestor but about 45% of the time and I have been asked by [facility] and Dr. Lenord Fellers to discuss this with the patient. I will attempt again at a later time.

## 2015-03-12 NOTE — Telephone Encounter (Signed)
Patient states that she is trying very hard to remember to take but she forgets a lot of times because that is the only pill she takes at night. I encouraged her to set a reminder on her cellphone that would alert her in the evenings to take it. She states that she is going to work on it and follow in March.

## 2015-05-13 ENCOUNTER — Other Ambulatory Visit (HOSPITAL_COMMUNITY)
Admission: RE | Admit: 2015-05-13 | Discharge: 2015-05-13 | Disposition: A | Discharge status: Home / Self Care | Payer: Medicare Other | Source: Ambulatory Visit | Attending: Internal Medicine | Admitting: Internal Medicine

## 2015-05-13 ENCOUNTER — Encounter: Payer: Self-pay | Admitting: Internal Medicine

## 2015-05-13 ENCOUNTER — Ambulatory Visit (INDEPENDENT_AMBULATORY_CARE_PROVIDER_SITE_OTHER): Payer: Medicare Other | Admitting: Internal Medicine

## 2015-05-13 VITALS — BP 130/80 | HR 82 | Temp 97.6°F | Resp 20 | Ht 67.0 in | Wt 225.0 lb

## 2015-05-13 DIAGNOSIS — Z0000 Encounter for general adult medical examination without abnormal findings: Secondary | ICD-10-CM

## 2015-05-13 DIAGNOSIS — Z79899 Other long term (current) drug therapy: Secondary | ICD-10-CM

## 2015-05-13 DIAGNOSIS — F411 Generalized anxiety disorder: Secondary | ICD-10-CM

## 2015-05-13 DIAGNOSIS — E669 Obesity, unspecified: Secondary | ICD-10-CM

## 2015-05-13 DIAGNOSIS — H9193 Unspecified hearing loss, bilateral: Secondary | ICD-10-CM | POA: Diagnosis not present

## 2015-05-13 DIAGNOSIS — I10 Essential (primary) hypertension: Secondary | ICD-10-CM | POA: Diagnosis not present

## 2015-05-13 DIAGNOSIS — Z124 Encounter for screening for malignant neoplasm of cervix: Secondary | ICD-10-CM | POA: Diagnosis not present

## 2015-05-13 DIAGNOSIS — J309 Allergic rhinitis, unspecified: Secondary | ICD-10-CM | POA: Diagnosis not present

## 2015-05-13 DIAGNOSIS — Z1329 Encounter for screening for other suspected endocrine disorder: Secondary | ICD-10-CM | POA: Diagnosis not present

## 2015-05-13 DIAGNOSIS — E8881 Metabolic syndrome: Secondary | ICD-10-CM

## 2015-05-13 DIAGNOSIS — Z8639 Personal history of other endocrine, nutritional and metabolic disease: Secondary | ICD-10-CM

## 2015-05-13 DIAGNOSIS — Z01419 Encounter for gynecological examination (general) (routine) without abnormal findings: Secondary | ICD-10-CM | POA: Diagnosis present

## 2015-05-13 DIAGNOSIS — E119 Type 2 diabetes mellitus without complications: Secondary | ICD-10-CM | POA: Diagnosis not present

## 2015-05-13 DIAGNOSIS — E785 Hyperlipidemia, unspecified: Secondary | ICD-10-CM | POA: Diagnosis not present

## 2015-05-13 DIAGNOSIS — E559 Vitamin D deficiency, unspecified: Secondary | ICD-10-CM

## 2015-05-13 LAB — LIPID PANEL
CHOLESTEROL: 161 mg/dL (ref 125–200)
HDL: 70 mg/dL (ref >=46)
LDL Cholesterol: 73 mg/dL (ref <130)
TRIGLYCERIDES: 88 mg/dL (ref <150)
Total CHOL/HDL Ratio: 2.3 Ratio (ref <=5.0)
VLDL: 18 mg/dL (ref <30)

## 2015-05-13 LAB — CBC WITH DIFFERENTIAL/PLATELET
BASOS ABS: 0.1 10*3/uL (ref 0.0–0.1)
Basophils Relative: 1 % (ref 0–1)
EOS ABS: 0.1 10*3/uL (ref 0.0–0.7)
EOS PCT: 2 % (ref 0–5)
HCT: 43.3 % (ref 36.0–46.0)
Hemoglobin: 14.8 g/dL (ref 12.0–15.0)
Lymphocytes Relative: 35 % (ref 12–46)
Lymphs Abs: 2.4 10*3/uL (ref 0.7–4.0)
MCH: 32.4 pg (ref 26.0–34.0)
MCHC: 34.2 g/dL (ref 30.0–36.0)
MCV: 94.7 fL (ref 78.0–100.0)
MPV: 10.1 fL (ref 8.6–12.4)
Monocytes Absolute: 0.4 10*3/uL (ref 0.1–1.0)
Monocytes Relative: 6 % (ref 3–12)
Neutro Abs: 3.9 10*3/uL (ref 1.7–7.7)
Neutrophils Relative %: 56 % (ref 43–77)
PLATELETS: 278 10*3/uL (ref 150–400)
RBC: 4.57 MIL/uL (ref 3.87–5.11)
RDW: 13.1 % (ref 11.5–15.5)
WBC: 6.9 10*3/uL (ref 4.0–10.5)

## 2015-05-13 LAB — COMPLETE METABOLIC PANEL WITH GFR
ALBUMIN: 4.3 g/dL (ref 3.6–5.1)
ALK PHOS: 91 U/L (ref 33–130)
ALT: 16 U/L (ref 6–29)
AST: 14 U/L (ref 10–35)
BUN: 15 mg/dL (ref 7–25)
CALCIUM: 10.1 mg/dL (ref 8.6–10.4)
CO2: 31 mmol/L (ref 20–31)
CREATININE: 0.66 mg/dL (ref 0.50–0.99)
Chloride: 99 mmol/L (ref 98–110)
Glucose, Bld: 146 mg/dL — ABNORMAL HIGH (ref 65–99)
Potassium: 4.7 mmol/L (ref 3.5–5.3)
Sodium: 139 mmol/L (ref 135–146)
TOTAL PROTEIN: 7.7 g/dL (ref 6.1–8.1)
Total Bilirubin: 0.7 mg/dL (ref 0.2–1.2)

## 2015-05-13 LAB — POCT URINALYSIS DIPSTICK
BILIRUBIN UA: NEGATIVE
Glucose, UA: NEGATIVE
Ketones, UA: NEGATIVE
LEUKOCYTES UA: NEGATIVE
NITRITE UA: NEGATIVE
PH UA: 6.5
PROTEIN UA: NEGATIVE
RBC UA: NEGATIVE
Spec Grav, UA: 1.01
UROBILINOGEN UA: 0.2

## 2015-05-13 LAB — TSH: TSH: 1.51 mIU/L

## 2015-05-13 NOTE — Progress Notes (Signed)
Subjective:    Patient ID: Terry Wilson, female    DOB: [birth year], 70 y.o.   MRN: [id number]  HPI 70 year old retired [place] Female in today for health maintenance exam and evaluation of medical issues. Patient has history of metabolic syndrome, hyperlipidemia, anxiety disorder, bilateral hearing loss, essential hypertension, controlled type 2 diabetes mellitus, obesity, allergic rhinitis. Weight is down 1 pound from physical exam in March 2016. Also has history of arthropathy being in right shoulder seen by orthopedist. History of vitamin D deficiency.  She wears bilateral hearing aids.  Past medical history: Bilateral tubal ligation in the 1980s. Fractured right ankle 1991. History of fractured right shoulder 2008. Cervical polyp removed in 2004.  Refuses influenza immunization.  Intolerant of Naprosyn, Clinoril, Macrobid. Cannot take sulfa. Is allergic to tetanus toxoid. Levaquin causes diarrhea and joint swelling.  Has annual eye exam by Dr. Hazle Quant each Fall.  Social history: She is married. One adult son.  Husband recently diagnosed with prostate cancer. He plans to have radiation treatment in the near future. 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  Family history: Father died at age 73. He lost ability to walk with history of back issues and arthritis and was under hospice care. He was complaining of unilateral leg pain prior to his demise and perhaps had a DVT or PE. Mother died with picks disease. 2 sisters in good health. One son with history of alcoholism and diabetes.   Review of Systems  Constitutional: Positive for fatigue.  HENT:       Bilateral hearing loss/ wears bilateral hearing aids  Eyes: Negative.   Respiratory: Negative.   Cardiovascular: Negative.   Genitourinary: Negative.   Neurological: Negative.   Hematological: Negative.   Psychiatric/Behavioral:       Anxiety disorder-worse recently with some  stress with husband's illness       Objective:   Physical Exam  Constitutional: She is oriented to person, place, and time. She appears well-developed and well-nourished. No distress.  HENT:  Head: Normocephalic and atraumatic.  Right Ear: External ear normal.  Left Ear: External ear normal.  Mouth/Throat: Oropharynx is clear and moist. No oropharyngeal exudate.  Eyes: Conjunctivae and EOM are normal. Pupils are equal, round, and reactive to light. Right eye exhibits no discharge. Left eye exhibits no discharge. No scleral icterus.  Neck: Neck supple. No JVD present. No thyromegaly present.  Cardiovascular: Normal rate, regular rhythm, normal heart sounds and intact distal pulses.   No murmur heard. Pulmonary/Chest: Effort normal and breath sounds normal. No respiratory distress. She has no wheezes. She has no rales. She exhibits no tenderness.  Breasts normal female without masses  Abdominal: Soft. Bowel sounds are normal. She exhibits no distension and no mass. There is no tenderness. There is no rebound and no guarding.  Genitourinary:  Pap taken today and will not be repeated due to age. Bimanual normal.  Musculoskeletal: Normal range of motion. She exhibits no edema.  Lymphadenopathy:    She has no cervical adenopathy.  Neurological: She is alert and oriented to person, place, and time. She has normal reflexes. No cranial nerve deficit. Coordination normal.  Skin: Skin is warm and dry. No rash noted. She is not diaphoretic.  Psychiatric: She has a normal mood and affect. Her behavior is normal. Judgment and thought content normal.  Vitals reviewed.         Assessment & Plan:  Essential hypertension-stable  Obesity-needs to lose weight  and be more motivated to diet and exercise  Anxiety disorder-recent stress with husband's illness  Controlled type 2 diabetes mellitus-labs are pending and drawn today  Metabolic syndrome  Bilateral hearing loss-wears bilateral hearing  aids  History of vitamin D deficiency-vitamin D level pending  Hyperlipidemia-take statin medication  Plan: Return in 6 months or as needed. Continue same medications. Review lab work and make further recommendations. Recommend annual mammogram. Annual diabetic eye exam. Patient declined Prevnar 13.  Subjective:   Patient presents for Medicare Annual/Subsequent preventive examination.  Review Past Medical/Family/Social:See above   Risk Factors  Current exercise habits: Mostly sedentary Dietary issues discussed: Low fat low carbohydrate  Cardiac risk factors:Hypertension, obesity, hyperlipidemia, diabetes  Depression Screen  (Note: if answer to either of the following is "Yes", a more complete depression screening is indicated)   Over the past two weeks, have you felt down, depressed or hopeless? No  Over the past two weeks, have you felt little interest or pleasure in doing things? No Have you lost interest or pleasure in daily life? No Do you often feel hopeless? No Do you cry easily over simple problems? No   Activities of Daily Living  In your present state of health, do you have any difficulty performing the following activities?:   Driving? No  Managing money? No  Feeding yourself? No  Getting from bed to chair? No  Climbing a flight of stairs? No  Preparing food and eating?: No  Bathing or showering? No  Getting dressed: No  Getting to the toilet? No  Using the toilet:No  Moving around from place to place: No  In the past year have you fallen or had a near fall?:No  Are you sexually active? yes Do you have more than one partner? No   Hearing Difficulties: No  Do you often ask people to speak up or repeat themselves? No  Do you experience ringing or noises in your ears? Yes from hearing aids Do you have difficulty understanding soft or whispered voices? Yes Do you feel that you have a problem with memory? No Do you often misplace items? No    Home Safety:    Do you have a smoke alarm at your residence? Yes Do you have grab bars in the bathroom?No Do you have throw rugs in your house? No   Cognitive Testing  Alert? Yes Normal Appearance?Yes  Oriented to person? Yes Place? Yes  Time? Yes  Recall of three objects? Yes  Can perform simple calculations? Yes  Displays appropriate judgment?Yes  Can read the correct time from a watch face?Yes   List the Names of Other Physician/Practitioners you currently use:  See referral list for the physicians patient is currently seeing.  Dr. Digby-ophthalmologist   Review of Systems: See above   Objective:     General appearance: Appears stated age and  obese  Head: Normocephalic, without obvious abnormality, atraumatic  Eyes: conj clear, EOMi PEERLA  Ears: normal TM's and external ear canals both ears  Nose: Nares normal. Septum midline. Mucosa normal. No drainage or sinus tenderness.  Throat: lips, mucosa, and tongue normal; teeth and gums normal  Neck: no adenopathy, no carotid bruit, no JVD, supple, symmetrical, trachea midline and thyroid not enlarged, symmetric, no tenderness/mass/nodules  No CVA tenderness.  Lungs: clear to auscultation bilaterally  Breasts: normal appearance, no masses or tenderness Heart: regular rate and rhythm, S1, S2 normal, no murmur, click, rub or gallop  Abdomen: soft, non-tender; bowel sounds normal; no masses, no  organomegaly  Musculoskeletal: ROM normal in all joints, no crepitus, no deformity, Normal muscle strengthen. Back  is symmetric, no curvature. Skin: Skin color, texture, turgor normal. No rashes or lesions  Lymph nodes: Cervical, supraclavicular, and axillary nodes normal.  Neurologic: CN 2 -12 Normal, Normal symmetric reflexes. Normal coordination and gait  Psych: Alert & Oriented x 3, Mood appear stable.    Assessment:    Annual wellness medicare exam   Plan:    During the course of the visit the patient was educated and counseled about  appropriate screening and preventive services including:  Annual mammogram  Annual diabetic eye exam      Patient Instructions (the written plan) was given to the patient.  Medicare Attestation  I have personally reviewed:  The patient's medical and social history  Their use of alcohol, tobacco or illicit drugs  Their current medications and supplements  The patient's functional ability including ADLs,fall risks, home safety risks, cognitive, and hearing and visual impairment  Diet and physical activities  Evidence for depression or mood disorders  The patient's weight, height, BMI, and visual acuity have been recorded in the chart. I have made referrals, counseling, and provided education to the patient based on review of the above and I have provided the patient with a written personalized care plan for preventive services.

## 2015-05-13 NOTE — Patient Instructions (Signed)
Continue same medications. Lab work is pending. Return in 6 months. Declined flu vaccine and Prevnar 13. Have annual diabetic eye exam. Watch blood pressure at home. Encouraged diet and exercise.

## 2015-05-14 LAB — VITAMIN D 25 HYDROXY (VIT D DEFICIENCY, FRACTURES): Vit D, 25-Hydroxy: 30 ng/mL (ref 30–100)

## 2015-05-14 LAB — HEMOGLOBIN A1C
HEMOGLOBIN A1C: 7.9 % — AB (ref <5.7)
Mean Plasma Glucose: 180 mg/dL — ABNORMAL HIGH (ref <117)

## 2015-05-14 LAB — MICROALBUMIN, URINE

## 2015-05-14 LAB — CYTOLOGY - PAP

## 2015-05-16 ENCOUNTER — Other Ambulatory Visit: Payer: Self-pay | Admitting: Internal Medicine

## 2015-06-17 ENCOUNTER — Other Ambulatory Visit: Payer: Self-pay | Admitting: Internal Medicine

## 2015-07-14 ENCOUNTER — Other Ambulatory Visit: Payer: Self-pay | Admitting: Internal Medicine

## 2015-07-23 ENCOUNTER — Telehealth: Payer: Self-pay

## 2015-07-23 ENCOUNTER — Encounter: Payer: Self-pay | Admitting: Internal Medicine

## 2015-07-23 ENCOUNTER — Ambulatory Visit (INDEPENDENT_AMBULATORY_CARE_PROVIDER_SITE_OTHER): Payer: Medicare Other | Admitting: Internal Medicine

## 2015-07-23 VITALS — BP 168/90 | HR 98 | Temp 99.7°F | Resp 20 | Wt 222.0 lb

## 2015-07-23 DIAGNOSIS — J069 Acute upper respiratory infection, unspecified: Secondary | ICD-10-CM

## 2015-07-23 DIAGNOSIS — J029 Acute pharyngitis, unspecified: Secondary | ICD-10-CM | POA: Diagnosis not present

## 2015-07-23 DIAGNOSIS — H6693 Otitis media, unspecified, bilateral: Secondary | ICD-10-CM

## 2015-07-23 LAB — POCT RAPID STREP A (OFFICE): RAPID STREP A SCREEN: NEGATIVE

## 2015-07-23 MED ORDER — AMOXICILLIN 500 MG PO CAPS: 500.0000 mg | ORAL_CAPSULE | Freq: Three times a day (TID) | ORAL | Status: DC

## 2015-07-23 NOTE — Patient Instructions (Signed)
Amoxicillin 500 mg 3 times daily for 10 days. Hycodan if needed for cough. Rest and drink plenty of fluids.

## 2015-07-23 NOTE — Telephone Encounter (Signed)
Pt states that her throat is sore and she would like to be worked in today. Per Dr. Lenord FellersBaxley- have pt here at 11:00

## 2015-07-23 NOTE — Progress Notes (Signed)
   Subjective:    Patient ID: Terry Wilson, female    DOB: 04/25/1945, 70 y.o.   MRN: [id number]  HPI 70 year old Female developed URI symptoms over the weekend of May 27. Has sore throat cough congestion and ear discomfort. She has bilateral hearing loss and wears hearing aids. Says she's had some fever as well. Some myalgias but no shaking chills.    Review of Systems as above     Objective:   Physical Exam  Pharynx is injected without exudate. Rapid strep screen is negative. Neck is supple. Chest clear to auscultation without rales or wheezing. Skin is warm and dry. Cardiac exam regular rate and rhythm normal S1 and S2. Extremities without edema TMs are dull bilaterally but not red.Rapid strep screen is negative      Assessment & Plan:  Acute URI  Acute pharyngitis  Acute bilateral otitis media  Plan: Amoxicillin 500 mg 3 times daily for 10 days. She has Hycodan cough syrup on hand if needed for cough. Rest and drink plenty of fluids.

## 2015-08-31 ENCOUNTER — Other Ambulatory Visit: Payer: Self-pay | Admitting: Internal Medicine

## 2015-08-31 NOTE — Telephone Encounter (Signed)
Refill x 6 months 

## 2015-09-01 NOTE — Telephone Encounter (Signed)
Phoned to pharmacy 

## 2015-11-18 ENCOUNTER — Ambulatory Visit (INDEPENDENT_AMBULATORY_CARE_PROVIDER_SITE_OTHER): Payer: Medicare Other | Admitting: Internal Medicine

## 2015-11-18 ENCOUNTER — Encounter: Payer: Self-pay | Admitting: Internal Medicine

## 2015-11-18 VITALS — BP 126/88 | HR 83 | Temp 97.6°F | Wt 221.0 lb

## 2015-11-18 DIAGNOSIS — E785 Hyperlipidemia, unspecified: Secondary | ICD-10-CM | POA: Diagnosis not present

## 2015-11-18 DIAGNOSIS — F411 Generalized anxiety disorder: Secondary | ICD-10-CM

## 2015-11-18 DIAGNOSIS — M791 Myalgia: Secondary | ICD-10-CM | POA: Diagnosis not present

## 2015-11-18 DIAGNOSIS — R0989 Other specified symptoms and signs involving the circulatory and respiratory systems: Secondary | ICD-10-CM

## 2015-11-18 DIAGNOSIS — J309 Allergic rhinitis, unspecified: Secondary | ICD-10-CM

## 2015-11-18 DIAGNOSIS — E139 Other specified diabetes mellitus without complications: Secondary | ICD-10-CM | POA: Diagnosis not present

## 2015-11-18 DIAGNOSIS — I10 Essential (primary) hypertension: Secondary | ICD-10-CM

## 2015-11-18 DIAGNOSIS — G4700 Insomnia, unspecified: Secondary | ICD-10-CM

## 2015-11-18 DIAGNOSIS — R030 Elevated blood-pressure reading, without diagnosis of hypertension: Secondary | ICD-10-CM | POA: Diagnosis not present

## 2015-11-18 DIAGNOSIS — Z91018 Allergy to other foods: Secondary | ICD-10-CM | POA: Diagnosis not present

## 2015-11-18 DIAGNOSIS — M7918 Myalgia, other site: Secondary | ICD-10-CM

## 2015-11-18 DIAGNOSIS — E669 Obesity, unspecified: Secondary | ICD-10-CM

## 2015-11-18 LAB — HEPATIC FUNCTION PANEL
ALBUMIN: 4.3 g/dL (ref 3.6–5.1)
ALK PHOS: 77 U/L (ref 33–130)
ALT: 21 U/L (ref 6–29)
AST: 19 U/L (ref 10–35)
BILIRUBIN INDIRECT: 0.5 mg/dL (ref 0.2–1.2)
BILIRUBIN TOTAL: 0.6 mg/dL (ref 0.2–1.2)
Bilirubin, Direct: 0.1 mg/dL (ref <=0.2)
Total Protein: 7.4 g/dL (ref 6.1–8.1)

## 2015-11-18 LAB — LIPID PANEL
Cholesterol: 159 mg/dL (ref 125–200)
HDL: 59 mg/dL
LDL Cholesterol: 73 mg/dL
Total CHOL/HDL Ratio: 2.7 ratio
Triglycerides: 136 mg/dL
VLDL: 27 mg/dL

## 2015-11-18 MED ORDER — PREDNISONE 10 MG PO TABS: ORAL_TABLET | ORAL | 0 refills | Status: DC

## 2015-11-18 NOTE — Patient Instructions (Signed)
Return in 2 weeks for blood pressure check. Continue same medications. Labs drawn and pending. Patient declines flu vaccine and Prevnar 13. Continue meloxicam for musculoskeletal pain. Prednisone taper given for allergic rhinitis symptoms.

## 2015-11-18 NOTE — Progress Notes (Signed)
   Subjective:    Patient ID: Terry Wilson, female    DOB: 03/21/1945, 70 y.o.   MRN: [id number]  HPI  In for 6 month recheck. Says she has learned she is allergic to watermelon and coconut. These caused mouth itching. Says accuchecks running about 140. Labs pending. Doesn't get a lot of exercise outside of working around the house.  C/O allergy symptoms. May try Zyrtec instead of Allegra. We'll give 6 day prednisone taper for allergy symptoms. No discolored nasal drainage just some postnasal drip.  Husband received radiation seed implants for prostate cancer and is doing fairly well. This is of concern to patient and advanced to her anxiety. Having insomnia. Not taking a whole Xanax at bedtime.  Blood pressure is elevated today despite taking Xanax before coming to the office.   Review of Systems insomnia-always anxious and worries a lot     Objective:   Physical Exam Pharynx slightly injected. TMs clear. Neck supple. Chest clear to auscultation cardiac exam regular rate and rhythm normal S1 and S2. Extremity is without edema.       Assessment & Plan:  Allergic rhinitis-prednisone taper given  Controlled type 2 diabetes mellitus  Obesity-encouraged diet exercise and weight loss  Hypertension-blood pressure elevated today despite taking medications- return in 2 weeks for office visit blood pressure check  Hearing loss-still has issues with hearing. Patient frequently asked to have  conversation repeated to her  Declines flu vaccine and Prevnar 13. Had reaction to Pneumovax 23 previously.  Food allergies-noted above  Hyperlipidemia-lipid panel liver functions pending she is on Crestor.  Musculoskeletal pain-treated with meloxicam  Anxiety  Insomnia-related to anxiety and musculoskeletal pain

## 2015-11-19 ENCOUNTER — Other Ambulatory Visit: Payer: Self-pay | Admitting: Internal Medicine

## 2015-11-19 LAB — HEMOGLOBIN A1C
Hgb A1c MFr Bld: 7.8 % — ABNORMAL HIGH (ref <5.7)
MEAN PLASMA GLUCOSE: 177 mg/dL

## 2015-12-03 ENCOUNTER — Other Ambulatory Visit: Payer: Self-pay | Admitting: Internal Medicine

## 2015-12-03 NOTE — Telephone Encounter (Signed)
Verbal order by Dr. Lenord FellersBaxley to refill Amlodipine Besylate 10mg  x 1 year.  To [facility] to e-scribe.

## 2015-12-21 ENCOUNTER — Other Ambulatory Visit: Payer: Self-pay | Admitting: Internal Medicine

## 2015-12-30 ENCOUNTER — Other Ambulatory Visit: Payer: Self-pay | Admitting: Internal Medicine

## 2016-01-01 ENCOUNTER — Other Ambulatory Visit: Payer: Self-pay | Admitting: Internal Medicine

## 2016-01-10 ENCOUNTER — Other Ambulatory Visit: Payer: Self-pay | Admitting: Internal Medicine

## 2016-02-02 ENCOUNTER — Ambulatory Visit (INDEPENDENT_AMBULATORY_CARE_PROVIDER_SITE_OTHER): Payer: Medicare Other | Admitting: Internal Medicine

## 2016-02-02 ENCOUNTER — Encounter: Payer: Self-pay | Admitting: Internal Medicine

## 2016-02-02 VITALS — BP 128/68 | HR 80 | Temp 98.7°F | Ht 67.0 in | Wt 220.0 lb

## 2016-02-02 DIAGNOSIS — E119 Type 2 diabetes mellitus without complications: Secondary | ICD-10-CM

## 2016-02-02 DIAGNOSIS — H6502 Acute serous otitis media, left ear: Secondary | ICD-10-CM | POA: Diagnosis not present

## 2016-02-02 DIAGNOSIS — H903 Sensorineural hearing loss, bilateral: Secondary | ICD-10-CM | POA: Diagnosis not present

## 2016-02-02 DIAGNOSIS — J069 Acute upper respiratory infection, unspecified: Secondary | ICD-10-CM | POA: Diagnosis not present

## 2016-02-02 MED ORDER — AMOXICILLIN 500 MG PO CAPS: 500.0000 mg | ORAL_CAPSULE | Freq: Three times a day (TID) | ORAL | 0 refills | Status: DC

## 2016-02-02 MED ORDER — HYDROCODONE-HOMATROPINE 5-1.5 MG/5ML PO SYRP: 5.0000 mL | ORAL_SOLUTION | Freq: Three times a day (TID) | ORAL | 0 refills | Status: DC | PRN

## 2016-02-02 NOTE — Progress Notes (Signed)
   Subjective:    Patient ID: Terry Wilson, female    DOB: 05/03/1945, 70 y.o.   MRN: [id number]  HPI   70 year old Female with onset late last week with URI symptoms. No fever or shaking chills.Has had cough with some productive discolored sputum. No shortness of breath or wheezing. Has malaise and fatigue. Husband has similar illness but would not come to the doctor.    Review of Systems see above     Objective:   Physical Exam Skin warm and dry. Nodes none. She is hard of hearing and wears hearing aids. Right TM clear. Left TM slightly full. Neck is supple. Chest clear to auscultation without rales or wheezing.       Assessment & Plan:  Acute URI  Left serous otitis media  History of diabetes mellitus  Plan: Amoxicillin 500 mg 3 times daily for 10 days. Hycodan 1 teaspoon by mouth every 8 hours when necessary cough. Rest and drink plenty of fluids.

## 2016-02-02 NOTE — Patient Instructions (Signed)
Amoxicillin 500 mg 3 times daily for 10 days. Hycodan 1 teaspoon by mouth every 8 hours when necessary cough. Rest and drink plenty of fluids.

## 2016-03-06 ENCOUNTER — Other Ambulatory Visit: Payer: Self-pay | Admitting: Internal Medicine

## 2016-03-06 NOTE — Telephone Encounter (Signed)
Refill x 6 months 

## 2016-03-08 NOTE — Telephone Encounter (Signed)
called into [id number] - [place], [place] [address]: [phone]

## 2016-05-20 ENCOUNTER — Other Ambulatory Visit: Payer: Medicare Other | Admitting: Internal Medicine

## 2016-05-20 ENCOUNTER — Encounter: Payer: Self-pay | Admitting: Internal Medicine

## 2016-05-20 ENCOUNTER — Ambulatory Visit (INDEPENDENT_AMBULATORY_CARE_PROVIDER_SITE_OTHER): Payer: Medicare Other | Admitting: Internal Medicine

## 2016-05-20 VITALS — BP 122/72 | HR 80 | Temp 98.6°F | Ht 65.5 in | Wt 216.0 lb

## 2016-05-20 DIAGNOSIS — M791 Myalgia: Secondary | ICD-10-CM | POA: Diagnosis not present

## 2016-05-20 DIAGNOSIS — E7849 Other hyperlipidemia: Secondary | ICD-10-CM

## 2016-05-20 DIAGNOSIS — J302 Other seasonal allergic rhinitis: Secondary | ICD-10-CM | POA: Diagnosis not present

## 2016-05-20 DIAGNOSIS — I10 Essential (primary) hypertension: Secondary | ICD-10-CM

## 2016-05-20 DIAGNOSIS — R8290 Unspecified abnormal findings in urine: Secondary | ICD-10-CM | POA: Diagnosis not present

## 2016-05-20 DIAGNOSIS — E119 Type 2 diabetes mellitus without complications: Secondary | ICD-10-CM

## 2016-05-20 DIAGNOSIS — F411 Generalized anxiety disorder: Secondary | ICD-10-CM

## 2016-05-20 DIAGNOSIS — Z1329 Encounter for screening for other suspected endocrine disorder: Secondary | ICD-10-CM

## 2016-05-20 DIAGNOSIS — Z0000 Encounter for general adult medical examination without abnormal findings: Secondary | ICD-10-CM

## 2016-05-20 DIAGNOSIS — E785 Hyperlipidemia, unspecified: Secondary | ICD-10-CM

## 2016-05-20 DIAGNOSIS — Z8639 Personal history of other endocrine, nutritional and metabolic disease: Secondary | ICD-10-CM

## 2016-05-20 DIAGNOSIS — H903 Sensorineural hearing loss, bilateral: Secondary | ICD-10-CM

## 2016-05-20 DIAGNOSIS — E8881 Metabolic syndrome: Secondary | ICD-10-CM | POA: Diagnosis not present

## 2016-05-20 DIAGNOSIS — M7918 Myalgia, other site: Secondary | ICD-10-CM

## 2016-05-20 DIAGNOSIS — Z6835 Body mass index (BMI) 35.0-35.9, adult: Secondary | ICD-10-CM

## 2016-05-20 LAB — POCT URINALYSIS DIPSTICK
BILIRUBIN UA: NEGATIVE
Blood, UA: NEGATIVE
Glucose, UA: NEGATIVE
KETONES UA: NEGATIVE
Nitrite, UA: NEGATIVE
PH UA: 7 (ref 5.0–8.0)
PROTEIN UA: NEGATIVE
SPEC GRAV UA: 1.005 (ref 1.030–1.035)
Urobilinogen, UA: 0.2 (ref ?–2.0)

## 2016-05-20 LAB — LIPID PANEL
Cholesterol: 157 mg/dL (ref <200)
HDL: 66 mg/dL (ref >50)
LDL Cholesterol: 69 mg/dL (ref <100)
TRIGLYCERIDES: 108 mg/dL (ref <150)
Total CHOL/HDL Ratio: 2.4 Ratio (ref <5.0)
VLDL: 22 mg/dL (ref <30)

## 2016-05-20 LAB — CBC WITH DIFFERENTIAL/PLATELET
Basophils Absolute: 0 cells/uL (ref 0–200)
Basophils Relative: 0 %
EOS PCT: 2 %
Eosinophils Absolute: 126 cells/uL (ref 15–500)
HCT: 44.4 % (ref 35.0–45.0)
Hemoglobin: 14.4 g/dL (ref 11.7–15.5)
LYMPHS ABS: 2331 {cells}/uL (ref 850–3900)
LYMPHS PCT: 37 %
MCH: 30.4 pg (ref 27.0–33.0)
MCHC: 32.4 g/dL (ref 32.0–36.0)
MCV: 93.9 fL (ref 80.0–100.0)
MONOS PCT: 7 %
MPV: 10.1 fL (ref 7.5–12.5)
Monocytes Absolute: 441 cells/uL (ref 200–950)
NEUTROS PCT: 54 %
Neutro Abs: 3402 cells/uL (ref 1500–7800)
PLATELETS: 274 10*3/uL (ref 140–400)
RBC: 4.73 MIL/uL (ref 3.80–5.10)
RDW: 13.1 % (ref 11.0–15.0)
WBC: 6.3 10*3/uL (ref 3.8–10.8)

## 2016-05-20 LAB — COMPREHENSIVE METABOLIC PANEL
ALT: 14 U/L (ref 6–29)
AST: 13 U/L (ref 10–35)
Albumin: 4.1 g/dL (ref 3.6–5.1)
Alkaline Phosphatase: 81 U/L (ref 33–130)
BILIRUBIN TOTAL: 0.7 mg/dL (ref 0.2–1.2)
BUN: 13 mg/dL (ref 7–25)
CHLORIDE: 100 mmol/L (ref 98–110)
CO2: 23 mmol/L (ref 20–31)
CREATININE: 0.68 mg/dL (ref 0.50–0.99)
Calcium: 9.9 mg/dL (ref 8.6–10.4)
GLUCOSE: 163 mg/dL — AB (ref 65–99)
Potassium: 4.9 mmol/L (ref 3.5–5.3)
SODIUM: 140 mmol/L (ref 135–146)
Total Protein: 7.4 g/dL (ref 6.1–8.1)

## 2016-05-20 LAB — TSH: TSH: 1.44 mIU/L

## 2016-05-20 MED ORDER — MUPIROCIN 2 % EX OINT: 1.0000 "application " | TOPICAL_OINTMENT | Freq: Two times a day (BID) | CUTANEOUS | 0 refills | Status: DC

## 2016-05-20 NOTE — Progress Notes (Signed)
Subjective:    Patient ID: Terry Wilson, female    DOB: 08/15/1945, 71 y.o.   MRN: [id number]  HPI 71 year old Female in today for health maintenance exam and evaluation of medical issues. Feels well. Has been taking meloxicam for knee pain and feels much better with regard to knee pain.  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 History of arthropathy right shoulder seen by orthopedist. History of vitamin D deficiency.  Past medical history: Bilateral tubal ligation in the 1980s. Fractured right ankle 1991. History of fractured right shoulder 2008. Cervical polyp removed in 2004.  Refuses flu vaccine.  Intolerant of Naprosyn, Clinoril, Macrobid. Cannot take sulfa. Is allergic to tetanus toxoid. Levaquin causes diarrhea and joint swelling.  Has annual eye exam by Dr. Hazle Quantigby each fall.  Social history: She is married. One adult son. Husband recently diagnosed with prostate cancer and had seed implants. 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 One adult son  Family history: Father died at age 70. He lost ability to walk with history of back issues and arthritis. He was complaining of unilateral leg pain prior to his demise and perhaps had a DVT or PE. Mother died with Pick's disease. 2 sisters in good health. One son with history of alcoholism and diabetes.  Fasting labs drawn and are pending.  Is getting some exercise redoing her house otherwise is mostly sedentary.  History of diabetes mellitus, hypertension, hyperlipidemia.  Complaining of soreness in right nostril. It appears to be irritated medial aspect. Generic Bactroban ointment prescribed.  She has bilateral hearing age due to hearing loss.  Review of Systems  Constitutional: Negative.   HENT:  Positive for hearing loss.   Respiratory: Negative.   Cardiovascular: Negative.   Gastrointestinal: Negative.   Endocrine: Negative.   Genitourinary: Negative.   Musculoskeletal:       Right shoulder pain. Knee pain.  Neurological: Negative.   Psychiatric/Behavioral:       Anxiety disorder       Objective:   Physical Exam  Constitutional: She is oriented to person, place, and time. She appears well-developed and well-nourished. No distress.  HENT:  Head: Normocephalic and atraumatic.  Right Ear: External ear normal.  Left Ear: External ear normal.  Mouth/Throat: Oropharynx is clear and moist.  Eyes: Conjunctivae and EOM are normal. Pupils are equal, round, and reactive to light. Right eye exhibits no discharge. Left eye exhibits no discharge.  Neck: Neck supple. No JVD present. No thyromegaly present.  Cardiovascular: Normal rate, regular rhythm and normal heart sounds.   No murmur heard. Pulmonary/Chest: Effort normal and breath sounds normal. No respiratory distress. She has no rales.  Abdominal: Soft. Bowel sounds are normal. She exhibits no distension and no mass. There is no tenderness. There is no rebound and no guarding.  Genitourinary:  Genitourinary Comments: Pap taken 2017. Not to be repeated due to age. Bimanual normal.  Musculoskeletal: She exhibits no edema.  Lymphadenopathy:    She has no cervical adenopathy.  Neurological: She is alert and oriented to person, place, and time. She has normal reflexes. No cranial nerve deficit. Coordination normal.  Skin: Skin is warm and dry. No rash noted. She is not diaphoretic.  Psychiatric: She has a normal mood and affect. Her behavior is normal. Thought content normal.  Vitals reviewed.  Assessment & Plan:  Bilateral hearing loss wears bilateral hearing aids  Anxiety disorder  Allergic rhinitis  Metabolic syndrome  Controlled type 2 diabetes mellitus  Obesity-really needs to take diet and exercise more  seriously  Essential hypertension  Hyperlipidemia  Musculoskeletal pain in knees and right shoulder arthropathy improved with meloxicam  Abnormal urinalysis-culture pending. May have UTI  Plan: Continue same medications. Lab work reviewed in results mailed to patient. Return in 6 months.  Subjective:   Patient presents for Medicare Annual/Subsequent preventive examination.  Review Past Medical/Family/Social:See above   Risk Factors  Current exercise habits: More sedentary and she should be Dietary issues discussed: Low fat low carbohydrate  Cardiac risk factors: Diabetes  Depression Screen  (Note: if answer to either of the following is "Yes", a more complete depression screening is indicated)   Over the past two weeks, have you felt down, depressed or hopeless? No  Over the past two weeks, have you felt little interest or pleasure in doing things? No Have you lost interest or pleasure in daily life? No Do you often feel hopeless? No Do you cry easily over simple problems? No   Activities of Daily Living  In your present state of health, do you have any difficulty performing the following activities?:   Driving? No  Managing money? No  Feeding yourself? No  Getting from bed to chair? No  Climbing a flight of stairs? No  Preparing food and eating?: No  Bathing or showering? No  Getting dressed: No  Getting to the toilet? No  Using the toilet:No  Moving around from place to place: No  In the past year have you fallen or had a near fall?:No  Are you sexually active? yes Do you have more than one partner? No   Hearing Difficulties: No  Do you often ask people to speak up or repeat themselves? Yes Do you experience ringing or noises in your ears? No  Do you have difficulty understanding soft or whispered voices? Yes Do you feel that you have a problem with memory? No Do you often misplace items? No    Home Safety:  Do you have a smoke alarm at your residence?  Yes Do you have grab bars in the bathroom?No Do you have throw rugs in your house? No   Cognitive Testing  Alert? Yes Normal Appearance?Yes  Oriented to person? Yes Place? Yes  Time? Yes  Recall of three objects? Yes  Can perform simple calculations? Yes  Displays appropriate judgment?Yes  Can read the correct time from a watch face?Yes   List the Names of Other Physician/Practitioners you currently use:  See referral list for the physicians patient is currently seeing.     Review of Systems: See above   Objective:     General appearance: Appears stated age and  obese  Head: Normocephalic, without obvious abnormality, atraumatic  Eyes: conj clear, EOMi PEERLA  Ears: normal TM's and external ear canals both ears  Nose: Nares normal. Septum midline. Mucosa normal. No drainage or sinus tenderness.  Throat: lips, mucosa, and tongue normal; teeth and gums normal  Neck: no adenopathy, no carotid bruit, no JVD, supple, symmetrical, trachea midline and thyroid not enlarged, symmetric, no tenderness/mass/nodules  No CVA tenderness.  Lungs: clear to auscultation bilaterally  Breasts: normal appearance, no masses or tenderness Heart: regular rate and rhythm, S1, S2 normal, no murmur, click, rub or gallop  Abdomen: soft, non-tender; bowel sounds normal; no masses, no organomegaly  Musculoskeletal: ROM normal  in all joints, no crepitus, no deformity, Normal muscle strengthen. Back  is symmetric, no curvature. Skin: Skin color, texture, turgor normal. No rashes or lesions  Lymph nodes: Cervical, supraclavicular, and axillary nodes normal.  Neurologic: CN 2 -12 Normal, Normal symmetric reflexes. Normal coordination and gait  Psych: Alert & Oriented x 3, Mood appear stable.    Assessment:    Annual wellness medicare exam   Plan:    During the course of the visit the patient was educated and counseled about appropriate screening and preventive services including:   Declines flu  vaccine  Recommend annual mammogram     Patient Instructions (the written plan) was given to the patient.  Medicare Attestation  I have personally reviewed:  The patient's medical and social history  Their use of alcohol, tobacco or illicit drugs  Their current medications and supplements  The patient's functional ability including ADLs,fall risks, home safety risks, cognitive, and hearing and visual impairment  Diet and physical activities  Evidence for depression or mood disorders  The patient's weight, height, BMI, and visual acuity have been recorded in the chart. I have made referrals, counseling, and provided education to the patient based on review of the above and I have provided the patient with a written personalized care plan for preventive services.

## 2016-05-20 NOTE — Progress Notes (Signed)
71 year old Female for health maintenance exam and evaluation of medical issues. Antiinflammatory med has helped knee pain. Labs pending. Needs mammogram.

## 2016-05-21 LAB — HEMOGLOBIN A1C
Hgb A1c MFr Bld: 7.7 % — ABNORMAL HIGH (ref <5.7)
MEAN PLASMA GLUCOSE: 174 mg/dL

## 2016-05-21 LAB — MICROALBUMIN / CREATININE URINE RATIO: CREATININE, URINE: 18 mg/dL — AB (ref 20–320)

## 2016-05-21 LAB — VITAMIN D 25 HYDROXY (VIT D DEFICIENCY, FRACTURES): VIT D 25 HYDROXY: 29 ng/mL — AB (ref 30–100)

## 2016-05-21 NOTE — Patient Instructions (Signed)
Continue to work on diet exercise and weight loss.  Continue same medications.  Return in 6 months. 

## 2016-05-22 LAB — URINE CULTURE

## 2016-07-24 ENCOUNTER — Other Ambulatory Visit: Payer: Self-pay | Admitting: Internal Medicine

## 2016-07-28 ENCOUNTER — Other Ambulatory Visit: Payer: Self-pay | Admitting: Internal Medicine

## 2016-10-03 ENCOUNTER — Other Ambulatory Visit: Payer: Self-pay | Admitting: Internal Medicine

## 2016-10-04 NOTE — Telephone Encounter (Signed)
Refill x 6 months 

## 2016-11-22 ENCOUNTER — Ambulatory Visit (INDEPENDENT_AMBULATORY_CARE_PROVIDER_SITE_OTHER): Payer: Medicare Other | Admitting: Internal Medicine

## 2016-11-22 ENCOUNTER — Encounter: Payer: Self-pay | Admitting: Internal Medicine

## 2016-11-22 VITALS — BP 130/80 | HR 82 | Temp 98.0°F | Wt 214.0 lb

## 2016-11-22 DIAGNOSIS — J029 Acute pharyngitis, unspecified: Secondary | ICD-10-CM

## 2016-11-22 DIAGNOSIS — J069 Acute upper respiratory infection, unspecified: Secondary | ICD-10-CM

## 2016-11-22 MED ORDER — HYDROCODONE-HOMATROPINE 5-1.5 MG/5ML PO SYRP: 5.0000 mL | ORAL_SOLUTION | Freq: Three times a day (TID) | ORAL | 0 refills | Status: DC | PRN

## 2016-11-22 MED ORDER — AMOXICILLIN 500 MG PO CAPS: 500.0000 mg | ORAL_CAPSULE | Freq: Three times a day (TID) | ORAL | 0 refills | Status: DC

## 2016-11-22 NOTE — Progress Notes (Signed)
   Subjective:    Patient ID: SAYRA FRISBY, female    DOB: 06-16-1945, 71 y.o.   MRN: [id number]  HPI 71 year old Female in today with URI symptoms for 2 days. Has  sore throat. No documented fever or chills. Some cough and runny nose.    Review of Systems says is very painful to swallow. No sputum production.     Objective:   Physical Exam TMs are chronically scarred and not red. Skin is warm and dry. No adenopathy in neck. Pharynx is red without exudate. Rapid strep screen is negative.        Assessment & Plan:  Acute pharyngitis  Acute URI  Plan: Amoxicillin 500 mg 3 times a day for 10 days. Hycodan syrup 1 teaspoon by mouth every 8 hours when necessary cough. Rest and drink plenty of fluids.

## 2016-11-22 NOTE — Patient Instructions (Signed)
Amoxicillin 500 mg 3 times daily for 10 days. Hycodan 1 teaspoon by mouth every 8 hours when necessary cough. Rest and drink plenty of fluids. Rapid strep screen negative.

## 2016-11-23 ENCOUNTER — Ambulatory Visit: Payer: Medicare Other | Admitting: Internal Medicine

## 2016-12-01 ENCOUNTER — Other Ambulatory Visit: Payer: Self-pay | Admitting: Internal Medicine

## 2016-12-07 ENCOUNTER — Encounter: Payer: Self-pay | Admitting: Internal Medicine

## 2016-12-07 ENCOUNTER — Ambulatory Visit (INDEPENDENT_AMBULATORY_CARE_PROVIDER_SITE_OTHER): Payer: Medicare Other | Admitting: Internal Medicine

## 2016-12-07 ENCOUNTER — Other Ambulatory Visit: Payer: Medicare Other | Admitting: Internal Medicine

## 2016-12-07 VITALS — BP 110/60 | HR 80 | Temp 98.7°F | Wt 219.0 lb

## 2016-12-07 DIAGNOSIS — E118 Type 2 diabetes mellitus with unspecified complications: Secondary | ICD-10-CM

## 2016-12-07 DIAGNOSIS — Z6835 Body mass index (BMI) 35.0-35.9, adult: Secondary | ICD-10-CM

## 2016-12-07 DIAGNOSIS — E785 Hyperlipidemia, unspecified: Secondary | ICD-10-CM

## 2016-12-07 DIAGNOSIS — I10 Essential (primary) hypertension: Secondary | ICD-10-CM | POA: Diagnosis not present

## 2016-12-07 DIAGNOSIS — E8881 Metabolic syndrome: Secondary | ICD-10-CM | POA: Diagnosis not present

## 2016-12-07 NOTE — Progress Notes (Signed)
   Subjective:    Patient ID: Terry Wilson, female    DOB: [birth year], 71 y.o.   MRN: [id number]  HPI   71 year old for 6 month recheck.  History of bilateral hearing loss and wears bilateral hearing aids.  History of metabolic syndrome, hyperlipidemia, anxiety disorder, essential hypertension and controlled type 2 diabetes mellitus as well as obesity and allergic rhinitis.  Hemoglobin A1c is 8.1% and previously was 7.7% 6 months ago.  Spoke with her once again about the importance of diet exercise and weight loss.  Lipid panel and liver functions are normal.  Currently diabetes is diet controlled.  She is on losartan, Crestor, amlodipine.  She takes Klonopin for anxiety.  Uses Flonase nasal spray and takes over-the-counter antihistamine for allergic rhinitis.  Takes meloxicam for musculoskeletal pain.  I think her diabetic control could be better with more effort but she may need medication.  She is going to return in 3 months.    Review of Systems see above     Objective:   Physical Exam  Constitutional: She appears well-developed and well-nourished.  Cardiovascular: Normal rate, regular rhythm and normal heart sounds.   Pulmonary/Chest: Effort normal and breath sounds normal. No respiratory distress. She has no wheezes. She has no rales.  Skin: Skin is warm and dry.  Vitals reviewed.         Assessment & Plan:  Diet-controlled diabetes mellitus-would like to see control improved and will reevaluate in 3 months after a trial of diet and exercise  Blood pressure is stable at 110/60 and she will continue same antihypertensive medications  Metabolic syndrome  Obesity  Hyperlipidemia-lipid panel liver functions normal on Crestor  Plan: Reevaluate in 3 months for diabetic control.  Patient declines flu vaccine.

## 2016-12-08 LAB — HEPATIC FUNCTION PANEL
AG RATIO: 1.4 (calc) (ref 1.0–2.5)
ALKALINE PHOSPHATASE (APISO): 76 U/L (ref 33–130)
ALT: 15 U/L (ref 6–29)
AST: 15 U/L (ref 10–35)
Albumin: 4.2 g/dL (ref 3.6–5.1)
BILIRUBIN INDIRECT: 0.7 mg/dL (ref 0.2–1.2)
Bilirubin, Direct: 0.1 mg/dL (ref 0.0–0.2)
Globulin: 3 g/dL (calc) (ref 1.9–3.7)
TOTAL PROTEIN: 7.2 g/dL (ref 6.1–8.1)
Total Bilirubin: 0.8 mg/dL (ref 0.2–1.2)

## 2016-12-08 LAB — HEMOGLOBIN A1C
EAG (MMOL/L): 10.3 (calc)
Hgb A1c MFr Bld: 8.1 % of total Hgb — ABNORMAL HIGH (ref <5.7)
Mean Plasma Glucose: 186 (calc)

## 2016-12-08 LAB — LIPID PANEL
Cholesterol: 151 mg/dL (ref <200)
HDL: 63 mg/dL (ref >50)
LDL Cholesterol (Calc): 69 mg/dL (calc)
NON-HDL CHOLESTEROL (CALC): 88 mg/dL (ref <130)
Total CHOL/HDL Ratio: 2.4 (calc) (ref <5.0)
Triglycerides: 106 mg/dL (ref <150)

## 2016-12-08 LAB — MICROALBUMIN / CREATININE URINE RATIO
CREATININE, URINE: 24 mg/dL (ref 20–275)
Microalb, Ur: <0.2 mg/dL

## 2016-12-11 ENCOUNTER — Other Ambulatory Visit: Payer: Self-pay | Admitting: Internal Medicine

## 2016-12-12 NOTE — Patient Instructions (Signed)
Work on diet exercise and weight loss.  Continue same medications and return in 3 months for reevaluation of diabetes.

## 2017-01-15 ENCOUNTER — Other Ambulatory Visit: Payer: Self-pay | Admitting: Internal Medicine

## 2017-02-02 ENCOUNTER — Other Ambulatory Visit: Payer: Self-pay | Admitting: Internal Medicine

## 2017-03-02 ENCOUNTER — Other Ambulatory Visit: Payer: Self-pay | Admitting: Internal Medicine

## 2017-03-02 DIAGNOSIS — E119 Type 2 diabetes mellitus without complications: Secondary | ICD-10-CM

## 2017-03-15 ENCOUNTER — Other Ambulatory Visit: Payer: Medicare Other | Admitting: Internal Medicine

## 2017-03-15 DIAGNOSIS — E119 Type 2 diabetes mellitus without complications: Secondary | ICD-10-CM

## 2017-03-16 LAB — HEMOGLOBIN A1C
EAG (MMOL/L): 9.8 (calc)
Hgb A1c MFr Bld: 7.8 % of total Hgb — ABNORMAL HIGH (ref <5.7)
MEAN PLASMA GLUCOSE: 177 (calc)

## 2017-03-17 ENCOUNTER — Ambulatory Visit (INDEPENDENT_AMBULATORY_CARE_PROVIDER_SITE_OTHER): Payer: Medicare Other | Admitting: Internal Medicine

## 2017-03-17 ENCOUNTER — Ambulatory Visit: Payer: Medicare Other | Admitting: Internal Medicine

## 2017-03-17 ENCOUNTER — Encounter: Payer: Self-pay | Admitting: Internal Medicine

## 2017-03-17 VITALS — BP 120/70 | HR 77 | Ht 65.5 in | Wt 212.0 lb

## 2017-03-17 DIAGNOSIS — E118 Type 2 diabetes mellitus with unspecified complications: Secondary | ICD-10-CM | POA: Diagnosis not present

## 2017-03-17 DIAGNOSIS — I10 Essential (primary) hypertension: Secondary | ICD-10-CM

## 2017-03-17 DIAGNOSIS — E8881 Metabolic syndrome: Secondary | ICD-10-CM | POA: Diagnosis not present

## 2017-03-17 DIAGNOSIS — E785 Hyperlipidemia, unspecified: Secondary | ICD-10-CM | POA: Diagnosis not present

## 2017-03-17 MED ORDER — ALBUTEROL SULFATE HFA 108 (90 BASE) MCG/ACT IN AERS: INHALATION_SPRAY | RESPIRATORY_TRACT | 99 refills | Status: DC

## 2017-03-17 MED ORDER — HYDROCORTISONE 2.5 % RE CREA: TOPICAL_CREAM | RECTAL | 5 refills | Status: DC

## 2017-03-17 MED ORDER — GLUCOSE BLOOD VI STRP: ORAL_STRIP | 3 refills | Status: DC

## 2017-03-17 NOTE — Patient Instructions (Addendum)
Continue diet for weight and glucose.  Try to get daily exercise.  Follow-up in April.  3 prescriptions refilled at her request.

## 2017-03-17 NOTE — Progress Notes (Signed)
   Subjective:    Patient ID: Terry Wilson, female    DOB: 10/04/1945, 72 y.o.   MRN: [id number]  HPI In today to follow-up for type 2 diabetes mellitus.  Last visit hemoglobin A1c was elevated a 8.1%.  She has cut out ice cream and made some other diet changes.  It is now 7.8% which is better.  She has lost 7 pounds since last visit.  She is now 212 pounds and previously was 219 pounds.  She does need to exercise.    Review of Systems     Objective:   Physical Exam  Not examined today.  Spent 15 minutes speaking with her about weight control, importance of exercise diabetic control      Assessment & Plan:  Controlled type 2 diabetes mellitus-hemoglobin A1c improved  BMI 34.74-recommend daily exercise would like patient to be less than 200 pounds  Hyperlipidemia-takes  Metabolic syndrome  Plan: She has follow-up appointment here in April for 5570-month recheck

## 2017-04-03 ENCOUNTER — Other Ambulatory Visit: Payer: Self-pay | Admitting: Internal Medicine

## 2017-04-04 NOTE — Telephone Encounter (Signed)
Refill x 6 months 

## 2017-04-04 NOTE — Telephone Encounter (Signed)
Informed patient

## 2017-05-18 ENCOUNTER — Other Ambulatory Visit: Payer: Self-pay | Admitting: Internal Medicine

## 2017-05-18 DIAGNOSIS — Z0000 Encounter for general adult medical examination without abnormal findings: Secondary | ICD-10-CM

## 2017-05-18 DIAGNOSIS — L689 Hypertrichosis, unspecified: Secondary | ICD-10-CM

## 2017-05-18 DIAGNOSIS — Z1329 Encounter for screening for other suspected endocrine disorder: Secondary | ICD-10-CM

## 2017-05-18 DIAGNOSIS — E119 Type 2 diabetes mellitus without complications: Secondary | ICD-10-CM

## 2017-05-18 DIAGNOSIS — E8881 Metabolic syndrome: Secondary | ICD-10-CM

## 2017-05-18 DIAGNOSIS — E785 Hyperlipidemia, unspecified: Secondary | ICD-10-CM

## 2017-05-18 DIAGNOSIS — E559 Vitamin D deficiency, unspecified: Secondary | ICD-10-CM

## 2017-05-26 ENCOUNTER — Ambulatory Visit (INDEPENDENT_AMBULATORY_CARE_PROVIDER_SITE_OTHER): Payer: Medicare Other | Admitting: Internal Medicine

## 2017-05-26 ENCOUNTER — Other Ambulatory Visit: Payer: Medicare Other | Admitting: Internal Medicine

## 2017-05-26 ENCOUNTER — Encounter: Payer: Self-pay | Admitting: Internal Medicine

## 2017-05-26 VITALS — BP 128/60 | HR 90 | Ht 66.0 in | Wt 212.0 lb

## 2017-05-26 DIAGNOSIS — J302 Other seasonal allergic rhinitis: Secondary | ICD-10-CM | POA: Diagnosis not present

## 2017-05-26 DIAGNOSIS — E8881 Metabolic syndrome: Secondary | ICD-10-CM

## 2017-05-26 DIAGNOSIS — E785 Hyperlipidemia, unspecified: Secondary | ICD-10-CM

## 2017-05-26 DIAGNOSIS — Z91018 Allergy to other foods: Secondary | ICD-10-CM

## 2017-05-26 DIAGNOSIS — Z1329 Encounter for screening for other suspected endocrine disorder: Secondary | ICD-10-CM

## 2017-05-26 DIAGNOSIS — I10 Essential (primary) hypertension: Secondary | ICD-10-CM

## 2017-05-26 DIAGNOSIS — Z6834 Body mass index (BMI) 34.0-34.9, adult: Secondary | ICD-10-CM

## 2017-05-26 DIAGNOSIS — L689 Hypertrichosis, unspecified: Secondary | ICD-10-CM

## 2017-05-26 DIAGNOSIS — H903 Sensorineural hearing loss, bilateral: Secondary | ICD-10-CM

## 2017-05-26 DIAGNOSIS — Z0000 Encounter for general adult medical examination without abnormal findings: Secondary | ICD-10-CM | POA: Diagnosis not present

## 2017-05-26 DIAGNOSIS — E119 Type 2 diabetes mellitus without complications: Secondary | ICD-10-CM

## 2017-05-26 DIAGNOSIS — Z8659 Personal history of other mental and behavioral disorders: Secondary | ICD-10-CM | POA: Diagnosis not present

## 2017-05-26 DIAGNOSIS — M7918 Myalgia, other site: Secondary | ICD-10-CM | POA: Diagnosis not present

## 2017-05-26 DIAGNOSIS — E559 Vitamin D deficiency, unspecified: Secondary | ICD-10-CM

## 2017-05-26 LAB — POCT URINALYSIS DIPSTICK
Appearance: NORMAL
Bilirubin, UA: NEGATIVE
Blood, UA: NEGATIVE
GLUCOSE UA: NEGATIVE
Ketones, UA: NEGATIVE
LEUKOCYTES UA: NEGATIVE
Nitrite, UA: NEGATIVE
Odor: NORMAL
Protein, UA: NEGATIVE
SPEC GRAV UA: 1.01 (ref 1.010–1.025)
Urobilinogen, UA: 0.2 E.U./dL
pH, UA: 7 (ref 5.0–8.0)

## 2017-05-26 NOTE — Progress Notes (Signed)
Subjective:    Patient ID: Terry Wilson, female    DOB: 1945/08/01, 72 y.o.   MRN: [id number]  HPI 72 year old Female for health maintenance exam and evaluation of medical issues. Hx DM, hearing loss, metabolic syndrome, hyperlipidemia, anxiety essential hypertension allergic rhinitis.  History of vitamin D deficiency.  History of arthropathy right shoulder seen by orthopedist.   refuses flu vaccine.  Past medical history: Bilateral tubal ligation in the 1980s.  Fractured right ankle 1991.  History of fractured right shoulder 2008.  Cervical polyp removed 2004.  Seeing Dr. Margo Aye for urticaria on face.  Fasting labs drawn and pending.  AIC was 7.8% 3 months ago.  Intolerant of Naprosyn, Clinoril, Macrobid.  Cannot take sulfa.  Is allergic to tetanus toxoid.  Levaquin causes diarrhea and joint swelling.  Has annual eye exam by Dr. Hazle Quant each fall.  Social history: She is married.  One adult son.  Husband with history of prostate cancer who had radioactive seed implants.  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  One adult son.  Family history: Father died at age 82.  He lost the ability to walk with history of back issues and arthritis.  He was complaining of unilateral leg pain prior to his demise and perhaps had a DVT or PE.  Mother died of complications of Pick's disease.  2 sisters in good health.  One son with history of alcoholism and diabetes.   Wears hearing aids but still has hearing issues.  Review of Systems  Respiratory: Negative.   Cardiovascular: Negative.   Gastrointestinal: Negative.   Musculoskeletal:       Musculoskeletal pain  Neurological: Negative.   Psychiatric/Behavioral:       Anxiety   Weight is 212 and stable. Needs to walk. Exercise discussed.     Objective:   Physical Exam  Constitutional: She is oriented to person, place, and time. She appears well-developed and well-nourished.  HENT:  Head:  Normocephalic and atraumatic.  Right Ear: External ear normal.  Left Ear: External ear normal.  Mouth/Throat: Oropharynx is clear and moist.  Eyes: Pupils are equal, round, and reactive to light. Conjunctivae and EOM are normal. Right eye exhibits no discharge. Left eye exhibits no discharge. No scleral icterus.  Neck: Neck supple. No JVD present. No thyromegaly present.  Cardiovascular: Normal rate, normal heart sounds and intact distal pulses.  No murmur heard. Pulmonary/Chest: Effort normal and breath sounds normal. No respiratory distress. She has no wheezes. She has no rales. She exhibits no tenderness.  Abdominal: Soft. Bowel sounds are normal. She exhibits no distension and no mass. There is no tenderness. There is no rebound and no guarding.  Genitourinary:  Genitourinary Comments: Pap taken in 2017.  Not to be repeated due to age.  Bimanual normal.  Musculoskeletal: She exhibits no edema.  Lymphadenopathy:    She has no cervical adenopathy.  Neurological: She is alert and oriented to person, place, and time. She displays normal reflexes. No cranial nerve deficit. Coordination normal.  Skin: Skin is warm and dry. No rash noted.  Psychiatric: She has a normal mood and affect. Her behavior is normal. Judgment and thought content normal.  Vitals reviewed.         Assessment & Plan:  Bilateral hearing loss with bilateral hearing aids  Anxiety  Allergic rhinitis  Metabolic syndrome  Controlled type 2 diabetes mellitus  Obesity-needs to take diet and exercise more seriously  Essential hypertension  Hyperlipidemia  Musculoskeletal pain in knees  Right shoulder arthropathy  Plan: Continue same medications and return in 6 months  Subjective:   Patient presents for Medicare Annual/Subsequent preventive examination.  Review Past Medical/Family/Social: See above   Risk Factors  Current exercise habits: Does not exercise enough Dietary issues discussed: Low-fat low  carbohydrate  Cardiac risk factors: Diabetes, hyperlipidemia, obesity,  Depression Screen  (Note: if answer to either of the following is "Yes", a more complete depression screening is indicated)   Over the past two weeks, have you felt down, depressed or hopeless? No  Over the past two weeks, have you felt little interest or pleasure in doing things? No Have you lost interest or pleasure in daily life? No Do you often feel hopeless? No Do you cry easily over simple problems? No   Activities of Daily Living  In your present state of health, do you have any difficulty performing the following activities?:   Driving? No  Managing money? No  Feeding yourself? No  Getting from bed to chair? No  Climbing a flight of stairs? No  Preparing food and eating?: No  Bathing or showering? No  Getting dressed: No  Getting to the toilet? No  Using the toilet:No  Moving around from place to place: No  In the past year have you fallen or had a near fall?:No  Are you sexually active?  yes Do you have more than one partner? No   Hearing Difficulties: do you often ask people to speak up or repeat themselves?  yes wears hearing aids Do you experience ringing or noises in your ears?  Do you have difficulty understanding soft or whispered voices?  yes wears hearing aid Do you feel that you have a problem with memory? No Do you often misplace items? No    Home Safety:  Do you have a smoke alarm at your residence? Yes Do you have grab bars in the bathroom?  No Do you have throw rugs in your house?  No   Cognitive Testing  Alert? Yes Normal Appearance?Yes  Oriented to person? Yes Place? Yes  Time? Yes  Recall of three objects? Yes  Can perform simple calculations? Yes  Displays appropriate judgment?Yes  Can read the correct time from a watch face?Yes   List the Names of Other Physician/Practitioners you currently use:  See referral list for the physicians patient is currently seeing.      Review of Systems: See above   Objective:     General appearance: Appears stated age and  obese  Head: Normocephalic, without obvious abnormality, atraumatic  Eyes: conj clear, EOMi PEERLA  Ears: normal TM's and external ear canals both ears  Nose: Nares normal. Septum midline. Mucosa normal. No drainage or sinus tenderness.  Throat: lips, mucosa, and tongue normal; teeth and gums normal  Neck: no adenopathy, no carotid bruit, no JVD, supple, symmetrical, trachea midline and thyroid not enlarged, symmetric, no tenderness/mass/nodules  No CVA tenderness.  Lungs: clear to auscultation bilaterally  Breasts: normal appearance, no masses or tenderness Heart: regular rate and rhythm, S1, S2 normal, no murmur, click, rub or gallop  Abdomen: soft, non-tender; bowel sounds normal; no masses, no organomegaly  Musculoskeletal: ROM normal in all joints, no crepitus, no deformity, Normal muscle strengthen. Back  is symmetric, no curvature. Skin: Skin color, texture, turgor normal. No rashes or lesions  Lymph nodes: Cervical, supraclavicular, and axillary nodes normal.  Neurologic: CN 2 -12 Normal, Normal symmetric reflexes. Normal coordination and gait  Psych: Alert & Oriented x 3, Mood appear stable.    Assessment:    Annual wellness medicare exam   Plan:    During the course of the visit the patient was educated and counseled about appropriate screening and preventive services including:   Annual mammogram  Refuses flu vaccine  Allergic to tetanus toxoid    Patient Instructions (the written plan) was given to the patient.  Medicare Attestation  I have personally reviewed:  The patient's medical and social history  Their use of alcohol, tobacco or illicit drugs  Their current medications and supplements  The patient's functional ability including ADLs,fall risks, home safety risks, cognitive, and hearing and visual impairment  Diet and physical activities  Evidence for  depression or mood disorders  The patient's weight, height, BMI, and visual acuity have been recorded in the chart. I have made referrals, counseling, and provided education to the patient based on review of the above and I have provided the patient with a written personalized care plan for preventive services.

## 2017-05-27 LAB — CBC WITH DIFFERENTIAL/PLATELET
BASOS ABS: 63 {cells}/uL (ref 0–200)
BASOS PCT: 1 %
EOS ABS: 252 {cells}/uL (ref 15–500)
Eosinophils Relative: 4 %
HCT: 40.7 % (ref 35.0–45.0)
Hemoglobin: 14.1 g/dL (ref 11.7–15.5)
Lymphs Abs: 2356 cells/uL (ref 850–3900)
MCH: 31.5 pg (ref 27.0–33.0)
MCHC: 34.6 g/dL (ref 32.0–36.0)
MCV: 90.8 fL (ref 80.0–100.0)
MPV: 10.7 fL (ref 7.5–12.5)
Monocytes Relative: 7 %
NEUTROS PCT: 50.6 %
Neutro Abs: 3188 cells/uL (ref 1500–7800)
PLATELETS: 260 10*3/uL (ref 140–400)
RBC: 4.48 10*6/uL (ref 3.80–5.10)
RDW: 11.5 % (ref 11.0–15.0)
TOTAL LYMPHOCYTE: 37.4 %
WBC: 6.3 10*3/uL (ref 3.8–10.8)
WBCMIX: 441 {cells}/uL (ref 200–950)

## 2017-05-27 LAB — LIPID PANEL
CHOLESTEROL: 157 mg/dL (ref <200)
HDL: 64 mg/dL (ref >50)
LDL Cholesterol (Calc): 74 mg/dL (calc)
Non-HDL Cholesterol (Calc): 93 mg/dL (calc) (ref <130)
Total CHOL/HDL Ratio: 2.5 (calc) (ref <5.0)
Triglycerides: 103 mg/dL (ref <150)

## 2017-05-27 LAB — VITAMIN D 25 HYDROXY (VIT D DEFICIENCY, FRACTURES): VIT D 25 HYDROXY: 25 ng/mL — AB (ref 30–100)

## 2017-05-27 LAB — COMPLETE METABOLIC PANEL WITH GFR
AG RATIO: 1.5 (calc) (ref 1.0–2.5)
ALBUMIN MSPROF: 4.4 g/dL (ref 3.6–5.1)
ALT: 11 U/L (ref 6–29)
AST: 12 U/L (ref 10–35)
Alkaline phosphatase (APISO): 82 U/L (ref 33–130)
BUN / CREAT RATIO: 25 (calc) — AB (ref 6–22)
BUN: 15 mg/dL (ref 7–25)
CALCIUM: 9.8 mg/dL (ref 8.6–10.4)
CO2: 33 mmol/L — ABNORMAL HIGH (ref 20–32)
CREATININE: 0.59 mg/dL — AB (ref 0.60–0.93)
Chloride: 100 mmol/L (ref 98–110)
GFR, EST AFRICAN AMERICAN: 108 mL/min/{1.73_m2} (ref >=60)
GFR, EST NON AFRICAN AMERICAN: 93 mL/min/{1.73_m2} (ref >=60)
GLOBULIN: 3 g/dL (ref 1.9–3.7)
Glucose, Bld: 137 mg/dL — ABNORMAL HIGH (ref 65–99)
Potassium: 4.3 mmol/L (ref 3.5–5.3)
SODIUM: 139 mmol/L (ref 135–146)
Total Bilirubin: 0.6 mg/dL (ref 0.2–1.2)
Total Protein: 7.4 g/dL (ref 6.1–8.1)

## 2017-05-27 LAB — HEMOGLOBIN A1C
Hgb A1c MFr Bld: 7.4 % of total Hgb — ABNORMAL HIGH (ref <5.7)
Mean Plasma Glucose: 166 (calc)
eAG (mmol/L): 9.2 (calc)

## 2017-05-27 LAB — MICROALBUMIN / CREATININE URINE RATIO
CREATININE, URINE: 9 mg/dL — AB (ref 20–275)
MICROALB UR: 0.2 mg/dL
Microalb Creat Ratio: 22 mcg/mg creat (ref <30)

## 2017-05-27 LAB — TSH: TSH: 1.43 m[IU]/L (ref 0.40–4.50)

## 2017-06-06 ENCOUNTER — Ambulatory Visit (INDEPENDENT_AMBULATORY_CARE_PROVIDER_SITE_OTHER): Payer: Medicare Other | Admitting: Internal Medicine

## 2017-06-06 ENCOUNTER — Encounter: Payer: Self-pay | Admitting: Internal Medicine

## 2017-06-06 VITALS — BP 130/68 | HR 75 | Temp 98.0°F | Ht 66.0 in

## 2017-06-06 DIAGNOSIS — L2389 Allergic contact dermatitis due to other agents: Secondary | ICD-10-CM | POA: Diagnosis not present

## 2017-06-06 MED ORDER — PREDNISONE 10 MG PO TABS: ORAL_TABLET | ORAL | 0 refills | Status: DC

## 2017-06-06 NOTE — Progress Notes (Signed)
   Subjective:    Patient ID: Terry Wilson, female    DOB: 04/17/1945, 72 y.o.   MRN: [id number]  HPI She was here on March 4 for physical examination.  Says Dr. Mickie HillierJack H had been treating her at that time with doxycyclineall for rash around her chin that have been present for some time.  This past weekend she was out in the sun for a very short period of time.  She still taking doxycycline.  She developed a rash on her anterior chest.  Also began to itch on her upper back.  She thinks she was out for less than an hour in the sun.  This would appear to be some type of contact dermatitis or photosensitivity reaction from the doxycycline.  Says it is very itchy.  Says rash on her chin is no better.  She tried to get an appointment with Dr. Margo AyeHall today and one could not be arranged.  Rash on the chin goes back several months.  Says that she was told it might be rosacea.  She changed skin products to Fitchburglinique but it never completely resolved.    Review of Systems see above     Objective:   Physical Exam She has a small red papules on her chin that look a bit acneform.  She has a confluent rash on her anterior chest that is erythematous.  It is not raised.  She has some macular redness on her upper posterior back and neck area.       Assessment & Plan:  It is not clear to me if this is a contact dermatitis or reaction to doxycycline/photosensitivity reaction  Plan: We will treat her with a prednisone taper going from 60 mg to 0 mg over 7 days.  If she is not improving in a few days I have encouraged her to get an appointment with Dr. Margo AyeHall.  She will continue using Clinique skin care products.  I have suggested she not use any sunscreen at the present time and stay out of the sun.

## 2017-06-06 NOTE — Patient Instructions (Signed)
Prednisone and tapering course going from 60 mg to 0 mg over 7 days.  Continue Clinique skin care products.  Stay out of the sun.  Discontinue doxycycline

## 2017-06-19 NOTE — Patient Instructions (Addendum)
Try to get more exercise and watch diet.  Follow-up in 6 months and continue same medications.  Lipids and A1c are stable.

## 2017-07-25 ENCOUNTER — Other Ambulatory Visit: Payer: Self-pay | Admitting: Internal Medicine

## 2017-10-11 ENCOUNTER — Other Ambulatory Visit: Payer: Self-pay | Admitting: Internal Medicine

## 2017-11-04 ENCOUNTER — Other Ambulatory Visit: Payer: Self-pay | Admitting: Internal Medicine

## 2017-11-08 ENCOUNTER — Other Ambulatory Visit: Payer: Self-pay | Admitting: Internal Medicine

## 2017-11-09 ENCOUNTER — Encounter: Payer: Self-pay | Admitting: Internal Medicine

## 2017-11-28 ENCOUNTER — Other Ambulatory Visit: Payer: Self-pay | Admitting: Internal Medicine

## 2017-11-28 DIAGNOSIS — M7918 Myalgia, other site: Secondary | ICD-10-CM

## 2017-11-28 DIAGNOSIS — E119 Type 2 diabetes mellitus without complications: Secondary | ICD-10-CM

## 2017-11-28 DIAGNOSIS — I10 Essential (primary) hypertension: Secondary | ICD-10-CM

## 2017-11-28 DIAGNOSIS — E785 Hyperlipidemia, unspecified: Secondary | ICD-10-CM

## 2017-11-28 DIAGNOSIS — E8881 Metabolic syndrome: Secondary | ICD-10-CM

## 2017-11-28 DIAGNOSIS — Z6834 Body mass index (BMI) 34.0-34.9, adult: Secondary | ICD-10-CM

## 2017-11-28 DIAGNOSIS — H903 Sensorineural hearing loss, bilateral: Secondary | ICD-10-CM

## 2017-11-28 DIAGNOSIS — J302 Other seasonal allergic rhinitis: Secondary | ICD-10-CM

## 2017-12-01 ENCOUNTER — Ambulatory Visit (INDEPENDENT_AMBULATORY_CARE_PROVIDER_SITE_OTHER): Payer: Medicare Other | Admitting: Internal Medicine

## 2017-12-01 ENCOUNTER — Other Ambulatory Visit: Payer: Medicare Other | Admitting: Internal Medicine

## 2017-12-01 ENCOUNTER — Encounter: Payer: Self-pay | Admitting: Internal Medicine

## 2017-12-01 VITALS — BP 120/70 | HR 76 | Temp 98.2°F | Ht 66.0 in | Wt 215.0 lb

## 2017-12-01 DIAGNOSIS — H903 Sensorineural hearing loss, bilateral: Secondary | ICD-10-CM

## 2017-12-01 DIAGNOSIS — J0100 Acute maxillary sinusitis, unspecified: Secondary | ICD-10-CM

## 2017-12-01 DIAGNOSIS — H6502 Acute serous otitis media, left ear: Secondary | ICD-10-CM

## 2017-12-01 DIAGNOSIS — E8881 Metabolic syndrome: Secondary | ICD-10-CM | POA: Diagnosis not present

## 2017-12-01 DIAGNOSIS — E785 Hyperlipidemia, unspecified: Secondary | ICD-10-CM

## 2017-12-01 DIAGNOSIS — Z6834 Body mass index (BMI) 34.0-34.9, adult: Secondary | ICD-10-CM

## 2017-12-01 DIAGNOSIS — I10 Essential (primary) hypertension: Secondary | ICD-10-CM | POA: Diagnosis not present

## 2017-12-01 DIAGNOSIS — E119 Type 2 diabetes mellitus without complications: Secondary | ICD-10-CM

## 2017-12-01 DIAGNOSIS — J302 Other seasonal allergic rhinitis: Secondary | ICD-10-CM

## 2017-12-01 DIAGNOSIS — M7918 Myalgia, other site: Secondary | ICD-10-CM

## 2017-12-01 MED ORDER — AMOXICILLIN 500 MG PO CAPS: 500.0000 mg | ORAL_CAPSULE | Freq: Three times a day (TID) | ORAL | 0 refills | Status: DC

## 2017-12-01 NOTE — Progress Notes (Signed)
   Subjective:    Patient ID: Terry Wilson, female    DOB: [birth year], 72 y.o.   MRN: [id number]  HPI 72 year old Female for 6 month recheck. Has DM,HTN, hyperlipidemia,hearing loss but wears hearing aids. Declines flu and pneumonia  vaccines.  Labs drawn and pending this am.  She is asking about local anesthetic for dental work. Sheworried about a reaction. Suggested 2% Lidocaine without epinephrine from the choices given by her dentist.  Not watching diet all that closely  Complaining of scratchy throat and sinus drainage.    Review of Systems  respiratory infection with scratchy throat and postnasal drip.  Think she is getting a sinus infection.     Objective:   Physical Exam  Constitutional: She appears well-developed and well-nourished. No distress.  HENT:  Right Ear: External ear normal.  Left Ear: External ear normal.  Mouth/Throat: Oropharynx is clear and moist.  Pharynx red without exudate  Neck: No JVD present. No thyromegaly present.  Cardiovascular: Normal rate, regular rhythm and normal heart sounds.  No murmur heard. Pulmonary/Chest: Effort normal and breath sounds normal. No stridor. No respiratory distress. She has no wheezes.  Lymphadenopathy:    She has no cervical adenopathy.  Skin: Skin is warm and dry. She is not diaphoretic.  Psychiatric: She has a normal mood and affect. Her behavior is normal. Thought content normal.  Vitals reviewed.         Assessment & Plan:  Essential hypertension-stable on current medications  Acute maxillary sinusitis -treat with Amoxicillin at her request  Hyperlipidemia-continue Crestor and lipid panel is normal as are liver functions  Anxiety treated with Klonopin  Controlled type 2 diabetes mellitus-increase in hemoglobin A1c from 7.4 to 7.8%.  She could do better with diet and exercise.  I recommended [facility].  Follow-up in 6 months.  25 minutes spent with patient

## 2017-12-02 LAB — HEPATIC FUNCTION PANEL
AG Ratio: 1.5 (calc) (ref 1.0–2.5)
ALT: 13 U/L (ref 6–29)
AST: 14 U/L (ref 10–35)
Albumin: 4.3 g/dL (ref 3.6–5.1)
Alkaline phosphatase (APISO): 74 U/L (ref 33–130)
Bilirubin, Direct: 0.1 mg/dL (ref 0.0–0.2)
Globulin: 2.9 g/dL (ref 1.9–3.7)
Indirect Bilirubin: 0.5 mg/dL (ref 0.2–1.2)
Total Bilirubin: 0.6 mg/dL (ref 0.2–1.2)
Total Protein: 7.2 g/dL (ref 6.1–8.1)

## 2017-12-02 LAB — HEMOGLOBIN A1C
Hgb A1c MFr Bld: 7.8 %{Hb} — ABNORMAL HIGH
Mean Plasma Glucose: 177 (calc)
eAG (mmol/L): 9.8 (calc)

## 2017-12-02 LAB — BASIC METABOLIC PANEL
BUN: 15 mg/dL (ref 7–25)
CALCIUM: 10 mg/dL (ref 8.6–10.4)
CO2: 32 mmol/L (ref 20–32)
Chloride: 100 mmol/L (ref 98–110)
Creat: 0.61 mg/dL (ref 0.60–0.93)
GLUCOSE: 141 mg/dL — AB (ref 65–99)
Potassium: 5 mmol/L (ref 3.5–5.3)
Sodium: 139 mmol/L (ref 135–146)

## 2017-12-02 LAB — LIPID PANEL
CHOL/HDL RATIO: 2.5 (calc) (ref <5.0)
Cholesterol: 150 mg/dL (ref <200)
HDL: 59 mg/dL (ref >50)
LDL CHOLESTEROL (CALC): 73 mg/dL
NON-HDL CHOLESTEROL (CALC): 91 mg/dL (ref <130)
Triglycerides: 97 mg/dL (ref <150)

## 2017-12-02 LAB — MICROALBUMIN / CREATININE URINE RATIO
CREATININE, URINE: 14 mg/dL — AB (ref 20–275)
Microalb Creat Ratio: 14 mcg/mg creat (ref <30)
Microalb, Ur: 0.2 mg/dL

## 2017-12-12 ENCOUNTER — Other Ambulatory Visit: Payer: Self-pay | Admitting: Internal Medicine

## 2017-12-22 NOTE — Patient Instructions (Signed)
Please work on diet exercise and weight loss.  Amoxicillin for acute maxillary sinusitis 3 times a day for 10 days.  Please continue same medications and follow-up in 6 months.

## 2018-01-23 ENCOUNTER — Other Ambulatory Visit: Payer: Self-pay | Admitting: Internal Medicine

## 2018-02-19 ENCOUNTER — Other Ambulatory Visit: Payer: Self-pay | Admitting: Internal Medicine

## 2018-04-07 ENCOUNTER — Ambulatory Visit (INDEPENDENT_AMBULATORY_CARE_PROVIDER_SITE_OTHER): Payer: Medicare Other | Admitting: Internal Medicine

## 2018-04-07 ENCOUNTER — Encounter: Payer: Self-pay | Admitting: Internal Medicine

## 2018-04-07 VITALS — BP 128/70 | HR 90 | Temp 98.6°F | Ht 66.0 in | Wt 214.0 lb

## 2018-04-07 DIAGNOSIS — H903 Sensorineural hearing loss, bilateral: Secondary | ICD-10-CM

## 2018-04-07 DIAGNOSIS — J0100 Acute maxillary sinusitis, unspecified: Secondary | ICD-10-CM

## 2018-04-07 DIAGNOSIS — E119 Type 2 diabetes mellitus without complications: Secondary | ICD-10-CM

## 2018-04-07 DIAGNOSIS — H6502 Acute serous otitis media, left ear: Secondary | ICD-10-CM

## 2018-04-07 DIAGNOSIS — J029 Acute pharyngitis, unspecified: Secondary | ICD-10-CM | POA: Diagnosis not present

## 2018-04-07 DIAGNOSIS — I10 Essential (primary) hypertension: Secondary | ICD-10-CM

## 2018-04-07 LAB — POCT RAPID STREP A (OFFICE): RAPID STREP A SCREEN: NEGATIVE

## 2018-04-07 MED ORDER — AMOXICILLIN 500 MG PO CAPS: 500.0000 mg | ORAL_CAPSULE | Freq: Three times a day (TID) | ORAL | 0 refills | Status: DC

## 2018-04-07 MED ORDER — HYDROCODONE-HOMATROPINE 5-1.5 MG/5ML PO SYRP: 5.0000 mL | ORAL_SOLUTION | Freq: Three times a day (TID) | ORAL | 0 refills | Status: DC | PRN

## 2018-04-07 NOTE — Progress Notes (Signed)
   Subjective:    Patient ID: Terry Wilson, female    DOB: 1946-01-05, 73 y.o.   MRN: [id number]  HPI 73 year old Female in today with complaint of respiratory infection symptoms.  Has cough, sore throat, malaise and fatigue.  No fever or shaking chills.  Has nasal congestion.  Has some sinus pressure.   Review of Systems see above-history of anxiety and gets anxious when she is ill     Objective:   Physical Exam Blood pressure 128/70.  Temperature 98.6 degrees pulse 90.  Pulse oximetry 94%.  Skin warm and dry.  She sounds nasally congested.  Pharynx is injected.  No exudate noted.  She is hard of hearing and wears hearing aids.  TMs are slightly full bilaterally.  Neck supple.  Chest clear.       Assessment & Plan:  Acute pharyngitis-non-strep  Acute bilateral serous otitis media  Anxiety state  Probable acute maxillary sinusitis  Diabetes mellitus without complication  Plan: Rapid strep screen is negative.  Prescribed amoxicillin 500 mg 3 times a day for 10 days.  Hycodan 1 teaspoon p.o. every 8 hours as needed cough.  Rest and drink plenty of fluids.

## 2018-04-14 ENCOUNTER — Other Ambulatory Visit: Payer: Self-pay | Admitting: Internal Medicine

## 2018-04-16 NOTE — Patient Instructions (Signed)
Take amoxicillin 500 mg 3 times a day for 10 days.  Hycodan sparingly as needed for cough and sore throat pain.  May take Tylenol for sore throat pain as well.  Rest and drink plenty of fluids.

## 2018-04-28 ENCOUNTER — Other Ambulatory Visit: Payer: Self-pay | Admitting: Internal Medicine

## 2018-05-10 ENCOUNTER — Other Ambulatory Visit: Payer: Self-pay | Admitting: Internal Medicine

## 2018-06-01 ENCOUNTER — Other Ambulatory Visit: Payer: Self-pay

## 2018-06-01 ENCOUNTER — Encounter: Payer: Self-pay | Admitting: Internal Medicine

## 2018-06-01 ENCOUNTER — Ambulatory Visit (INDEPENDENT_AMBULATORY_CARE_PROVIDER_SITE_OTHER): Payer: Medicare Other | Admitting: Internal Medicine

## 2018-06-01 DIAGNOSIS — I10 Essential (primary) hypertension: Secondary | ICD-10-CM

## 2018-06-01 DIAGNOSIS — E785 Hyperlipidemia, unspecified: Secondary | ICD-10-CM

## 2018-06-01 DIAGNOSIS — Z6834 Body mass index (BMI) 34.0-34.9, adult: Secondary | ICD-10-CM

## 2018-06-01 DIAGNOSIS — E1169 Type 2 diabetes mellitus with other specified complication: Secondary | ICD-10-CM

## 2018-06-01 DIAGNOSIS — H903 Sensorineural hearing loss, bilateral: Secondary | ICD-10-CM | POA: Diagnosis not present

## 2018-06-01 DIAGNOSIS — Z0000 Encounter for general adult medical examination without abnormal findings: Secondary | ICD-10-CM | POA: Diagnosis not present

## 2018-06-01 NOTE — Progress Notes (Signed)
   Subjective:    Patient ID: Terry Wilson, female    DOB: 1946-01-28, 73 y.o.   MRN: [id number]  HPI 73 year old Female in for Medicare Annnual Wellness visit seen by [employer] due to Coronavirus pandemic.  Patient agrees to visit in this format.  2 identifiers were used to identify the patient is Terry Wilson, a longstanding patient in this practice  No labs drawn for this visit.  Her last health maintenance exam was April 2019 and will be rescheduled.  A later date.  She last had lab studies done through this office Dec 22, 2017.  She has diabetes mellitus and in December 23, 2022 and hemoglobin A1c was 7.8%.  Lipid panel and liver functions were normal at that time as well as basic metabolic panel with the exception of a glucose of 141.  Patient remains overweight.  Counseled regarding diet exercise and weight loss.    Review of Systems no new complaints.  She is retired.  Staying at home with the pandemic.  Not getting much exercise.  Has not fallen in the past year.  She has hearing loss which is sensorineural in nature and wears hearing aids.  History of anxiety, essential hypertension, allergic rhinitis, hyperlipidemia and metabolic syndrome.  History of vitamin D deficiency.  Refuses annual flu vaccine.  Family history: Father died at age 65 playing of unilateral leg pain prior to his demise and perhaps had a DVT or PE.  Mother died of complications of pick's disease.  2 sisters in good health.  One son with history of alcoholism and diabetes.  Cardiac risk factors: Diabetes, hyperlipidemia, obesity  Denies being depressed but does have history of anxiety  No issues driving managing money feeding herself getting from bed to chair climbing a flight of stairs.  Food sharing getting dressed or going to the toilet.  Continues to have issues with hearing despite wearing bilateral hearing aids.  Frequently ask people to repeat themselves.  Does not  feel that she has a memory issue and does not misplace items.            Objective:   Physical Exam  Seen by virtual visit and not examined      Assessment & Plan:  Annual Medicare wellness visit  Essential hypertension  Controlled type 2 diabetes mellitus  Hyperlipidemia  Hearing loss  Obesity  Anxiety  Plan: Her in person physical exam and routine health maintenance evaluation scheduled for June 2020.

## 2018-06-14 ENCOUNTER — Ambulatory Visit (INDEPENDENT_AMBULATORY_CARE_PROVIDER_SITE_OTHER): Payer: Medicare Other | Admitting: Internal Medicine

## 2018-06-14 ENCOUNTER — Telehealth: Payer: Self-pay

## 2018-06-14 ENCOUNTER — Encounter: Payer: Self-pay | Admitting: Internal Medicine

## 2018-06-14 ENCOUNTER — Other Ambulatory Visit: Payer: Self-pay

## 2018-06-14 VITALS — Temp 98.5°F

## 2018-06-14 DIAGNOSIS — N390 Urinary tract infection, site not specified: Secondary | ICD-10-CM | POA: Diagnosis not present

## 2018-06-14 DIAGNOSIS — R8290 Unspecified abnormal findings in urine: Secondary | ICD-10-CM | POA: Diagnosis not present

## 2018-06-14 DIAGNOSIS — R350 Frequency of micturition: Secondary | ICD-10-CM | POA: Diagnosis not present

## 2018-06-14 DIAGNOSIS — R300 Dysuria: Secondary | ICD-10-CM | POA: Diagnosis not present

## 2018-06-14 LAB — POCT URINALYSIS DIPSTICK
Bilirubin, UA: NEGATIVE
Blood, UA: NEGATIVE
Glucose, UA: POSITIVE — AB
Ketones, UA: NEGATIVE
Nitrite, UA: NEGATIVE
Protein, UA: NEGATIVE
Spec Grav, UA: 1.01 (ref 1.010–1.025)
Urobilinogen, UA: 0.2 E.U./dL
pH, UA: 6.5 (ref 5.0–8.0)

## 2018-06-14 MED ORDER — CIPROFLOXACIN HCL 500 MG PO TABS: 500.0000 mg | ORAL_TABLET | Freq: Two times a day (BID) | ORAL | 0 refills | Status: DC

## 2018-06-14 MED ORDER — TERCONAZOLE 0.4 % VA CREA: 1.0000 | TOPICAL_CREAM | Freq: Every day | VAGINAL | 0 refills | Status: DC

## 2018-06-14 NOTE — Telephone Encounter (Signed)
Patient called states she thinks she has a UTI she can not come in right now she will call if she's able to come before 1pm today.

## 2018-06-14 NOTE — Telephone Encounter (Signed)
Pt coming in giver [place] today and OV

## 2018-06-16 LAB — URINE CULTURE
MICRO NUMBER:: 413732
SPECIMEN QUALITY:: ADEQUATE

## 2018-06-19 NOTE — Progress Notes (Deleted)
   Subjective:    Patient ID: Terry Wilson, female    DOB: [birth year], 73 y.o.   MRN: [id number]  HPI 73 year old Female with complaint of dysuria.  No hematuria.  No fever or shaking chills.  Says she thinks she has a urinary tract infection based on previous symptoms. Pt seen today in person in the office.  She had an E. coli UTI in 2018.  Review of Systems  See above     Objective:   Physical Exam  No CVA tenderness.  Dipstick urinalysis shows trace LE and glucose present.  Culture was sent.  She is afebrile.      Assessment & Plan:  Acute UTI  Plan: Cipro 500 mg twice daily x  7 days  Addendum: Culture grew E. Coli sensitive to Cipro.

## 2018-06-21 NOTE — Patient Instructions (Signed)
Patient will continue to work on diet exercise and weight loss.  She will have in person physical exam in this office in June 2020 along with fasting labs.

## 2018-06-21 NOTE — Progress Notes (Signed)
   Subjective:    Patient ID: Terry Wilson, female    DOB: 1945/09/20, 73 y.o.   MRN: [id number]  HPI 73 year old Female in today with complaint of urinary tract infection symptoms.  Last had E. coli UTI in 2018.  Symptoms include dysuria and suprapubic pressure.  No fever or chills.  Patient is seen in the office today.    Review of Systems no nausea or vomiting.     Objective:   Physical Exam  Clean-catch urine obtained.  Dipstick shows trace LE and glucose in urine.  She has a history of diabetes mellitus. She is afebrile.  No CVA tenderness.     Assessment & Plan:  Acute UTI  Plan: Cipro 500 mg twice daily for 7 days.  Culture is pending.  Addendum: Culture grew 50,000 100,000 CFU L/mL E. coli sensitive to Cipro which she is on.  We will not ask her to return to give a repeat specimen during this pandemic.

## 2018-06-21 NOTE — Patient Instructions (Signed)
Cipro 500 mg twice daily x 7 days. Culture pending 

## 2018-08-08 ENCOUNTER — Encounter: Payer: Self-pay | Admitting: Internal Medicine

## 2018-08-08 ENCOUNTER — Other Ambulatory Visit: Payer: Self-pay

## 2018-08-08 ENCOUNTER — Ambulatory Visit (INDEPENDENT_AMBULATORY_CARE_PROVIDER_SITE_OTHER): Payer: Medicare Other | Admitting: Internal Medicine

## 2018-08-08 VITALS — BP 124/70 | HR 74 | Temp 97.5°F | Ht 66.0 in | Wt 212.0 lb

## 2018-08-08 DIAGNOSIS — E8881 Metabolic syndrome: Secondary | ICD-10-CM

## 2018-08-08 DIAGNOSIS — Z6834 Body mass index (BMI) 34.0-34.9, adult: Secondary | ICD-10-CM

## 2018-08-08 DIAGNOSIS — I10 Essential (primary) hypertension: Secondary | ICD-10-CM

## 2018-08-08 DIAGNOSIS — H903 Sensorineural hearing loss, bilateral: Secondary | ICD-10-CM

## 2018-08-08 DIAGNOSIS — E785 Hyperlipidemia, unspecified: Secondary | ICD-10-CM | POA: Diagnosis not present

## 2018-08-08 DIAGNOSIS — F411 Generalized anxiety disorder: Secondary | ICD-10-CM

## 2018-08-08 DIAGNOSIS — R5383 Other fatigue: Secondary | ICD-10-CM

## 2018-08-08 DIAGNOSIS — E119 Type 2 diabetes mellitus without complications: Secondary | ICD-10-CM

## 2018-08-08 DIAGNOSIS — R350 Frequency of micturition: Secondary | ICD-10-CM

## 2018-08-08 DIAGNOSIS — Z0000 Encounter for general adult medical examination without abnormal findings: Secondary | ICD-10-CM

## 2018-08-08 DIAGNOSIS — M7918 Myalgia, other site: Secondary | ICD-10-CM

## 2018-08-08 LAB — POCT URINALYSIS DIP (CLINITEK)
Bilirubin, UA: NEGATIVE
Blood, UA: NEGATIVE
Glucose, UA: NEGATIVE mg/dL
Ketones, POC UA: NEGATIVE mg/dL
Leukocytes, UA: NEGATIVE
Nitrite, UA: NEGATIVE
POC PROTEIN,UA: NEGATIVE
Spec Grav, UA: 1.015 (ref 1.010–1.025)
Urobilinogen, UA: 1 E.U./dL
pH, UA: 5 (ref 5.0–8.0)

## 2018-08-08 LAB — COMPLETE METABOLIC PANEL WITH GFR
AG Ratio: 1.5 (calc) (ref 1.0–2.5)
ALT: 11 U/L (ref 6–29)
AST: 12 U/L (ref 10–35)
Albumin: 4.5 g/dL (ref 3.6–5.1)
Alkaline phosphatase (APISO): 89 U/L (ref 37–153)
BUN: 17 mg/dL (ref 7–25)
CO2: 32 mmol/L (ref 20–32)
Calcium: 10.4 mg/dL (ref 8.6–10.4)
Chloride: 99 mmol/L (ref 98–110)
Creat: 0.69 mg/dL (ref 0.60–0.93)
GFR, Est African American: 102 mL/min/{1.73_m2} (ref >=60)
GFR, Est Non African American: 88 mL/min/{1.73_m2} (ref >=60)
Globulin: 3 g/dL (calc) (ref 1.9–3.7)
Glucose, Bld: 157 mg/dL — ABNORMAL HIGH (ref 65–99)
Potassium: 4.5 mmol/L (ref 3.5–5.3)
Sodium: 138 mmol/L (ref 135–146)
Total Bilirubin: 0.7 mg/dL (ref 0.2–1.2)
Total Protein: 7.5 g/dL (ref 6.1–8.1)

## 2018-08-08 LAB — CBC WITH DIFFERENTIAL/PLATELET
Absolute Monocytes: 419 cells/uL (ref 200–950)
Basophils Absolute: 50 cells/uL (ref 0–200)
Basophils Relative: 0.7 %
Eosinophils Absolute: 312 cells/uL (ref 15–500)
Eosinophils Relative: 4.4 %
HCT: 42.9 % (ref 35.0–45.0)
Hemoglobin: 14.7 g/dL (ref 11.7–15.5)
Lymphs Abs: 2663 cells/uL (ref 850–3900)
MCH: 31.5 pg (ref 27.0–33.0)
MCHC: 34.3 g/dL (ref 32.0–36.0)
MCV: 91.9 fL (ref 80.0–100.0)
MPV: 11.1 fL (ref 7.5–12.5)
Monocytes Relative: 5.9 %
Neutro Abs: 3657 cells/uL (ref 1500–7800)
Neutrophils Relative %: 51.5 %
Platelets: 260 10*3/uL (ref 140–400)
RBC: 4.67 10*6/uL (ref 3.80–5.10)
RDW: 11.4 % (ref 11.0–15.0)
Total Lymphocyte: 37.5 %
WBC: 7.1 10*3/uL (ref 3.8–10.8)

## 2018-08-08 LAB — TSH: TSH: 1.44 mIU/L (ref 0.40–4.50)

## 2018-08-08 LAB — LIPID PANEL
Cholesterol: 160 mg/dL (ref <200)
HDL: 58 mg/dL (ref >=50)
LDL Cholesterol (Calc): 83 mg/dL (calc)
Non-HDL Cholesterol (Calc): 102 mg/dL (calc) (ref <130)
Total CHOL/HDL Ratio: 2.8 (calc) (ref <5.0)
Triglycerides: 101 mg/dL (ref <150)

## 2018-08-08 MED ORDER — HYDROCORTISONE (PERIANAL) 2.5 % EX CREA: 1.0000 "application " | TOPICAL_CREAM | Freq: Two times a day (BID) | CUTANEOUS | 2 refills | Status: DC

## 2018-08-08 MED ORDER — KETOCONAZOLE 2 % EX CREA: 1.0000 "application " | TOPICAL_CREAM | Freq: Every day | CUTANEOUS | 99 refills | Status: DC

## 2018-08-21 NOTE — Progress Notes (Signed)
   Subjective:    Patient ID: Terry Wilson, female    DOB: May 16, 1945, 73 y.o.   MRN: [id number]  HPI 73 year old Female in today for health maintenance exam and evaluation of medical issues.  She has a history of hearing loss and wears hearing aids.  History of anxiety, essential hypertension, allergic rhinitis, hyperlipidemia, metabolic syndrome and type 2 diabetes mellitus.  Refuses flu vaccine  Past medical history: Tubal ligation in the 1980s.  Fractured right ankle 1991.  History of fracture right shoulder 2008.  Cervical polyp removed 2004.  Has annual eye exam by Dr. [facility] fall.  Social history: She is married.  One adult son who has history of alcohol use disorder and recently had relapse.  Husband with history of prostate cancer who had radioactive seed implants.  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  Family history: Father died at age 80.  He lost the ability to walk with history of back issues and arthritis.  He was complaining of unilateral leg pain prior to his demise and perhaps had deep venous thrombosis or perhaps pulmonary embolus.  Mother died of complications of pick's disease.  2 sisters in good health.  Son with history of alcoholism and diabetes.  Despite wearing hearing aids she still has hearing issues.    Review of Systems history of anxiety     Objective:   Physical Exam Blood pressure 120/70, pulse 74, pulse oximetry 96%, temperature 97.5, weight 212 pounds, BMI 34.22, height 5 feet 6 inches  Skin warm and dry.  Nodes none.  TMs and pharynx are clear.  Neck is supple without JVD thyromegaly or carotid bruits.  Chest is clear to auscultation without rales or wheezing.  Cardiac exam regular rate and rhythm normal S1 and S2 without murmurs or gallops.  Breasts normal female without masses.  Abdomen obese soft nondistended without hepatosplenomegaly masses or tenderness.  Pap taken in 2017 and will not be  repeated due to age.  Bimanual normal.  Extremities without edema.  Neuro no focal deficits.  Affect is normal.                Assessment & Plan:  Sensorineural hearing loss-wears hearing aids  Diabetes mellitus-hemoglobin A1c was needs to be obtained.  Previously was 7.8%..  Fasting glucose 157.  Musculoskeletal pain in knees/osteoarthritis  Essential hypertension stable on current regimen  Hyperlipidemia-lipid panel normal  Metabolic syndrome  BMI [id number] to work on diet exercise and weight loss.  Have recommended Dr. [facility] Dk clinic.  Allergic rhinitis  Anxiety state treated with antianxiety medication  History of right shoulder arthropathy- no complaints at this time  Plan: Encourage diet exercise and weight loss and follow-up in 6 months.  She had Medicare wellness visit earlier this year.

## 2018-08-21 NOTE — Patient Instructions (Signed)
Return A1c which was overlooked by our staff.  Please work on diet exercise and weight loss as it is extremely important to your health.  Continue current medications.  Plan follow-up in 6 months.

## 2018-08-28 ENCOUNTER — Other Ambulatory Visit: Payer: Self-pay

## 2018-08-28 ENCOUNTER — Other Ambulatory Visit: Payer: Medicare Other | Admitting: Internal Medicine

## 2018-08-28 DIAGNOSIS — E119 Type 2 diabetes mellitus without complications: Secondary | ICD-10-CM

## 2018-08-29 LAB — HEMOGLOBIN A1C
Hgb A1c MFr Bld: 8.3 % of total Hgb — ABNORMAL HIGH (ref <5.7)
Mean Plasma Glucose: 192 (calc)
eAG (mmol/L): 10.6 (calc)

## 2018-10-28 ENCOUNTER — Other Ambulatory Visit: Payer: Self-pay | Admitting: Internal Medicine

## 2018-11-15 LAB — HM MAMMOGRAPHY

## 2018-11-16 ENCOUNTER — Other Ambulatory Visit: Payer: Self-pay | Admitting: Internal Medicine

## 2018-11-17 ENCOUNTER — Encounter: Payer: Self-pay | Admitting: Internal Medicine

## 2018-11-19 ENCOUNTER — Other Ambulatory Visit: Payer: Self-pay | Admitting: Internal Medicine

## 2018-11-23 ENCOUNTER — Other Ambulatory Visit: Payer: Self-pay | Admitting: Internal Medicine

## 2019-01-09 ENCOUNTER — Other Ambulatory Visit: Payer: Self-pay | Admitting: Internal Medicine

## 2019-02-08 ENCOUNTER — Other Ambulatory Visit: Payer: Self-pay

## 2019-02-08 ENCOUNTER — Ambulatory Visit (INDEPENDENT_AMBULATORY_CARE_PROVIDER_SITE_OTHER): Payer: Medicare Other | Admitting: Internal Medicine

## 2019-02-08 ENCOUNTER — Other Ambulatory Visit: Payer: Medicare Other | Admitting: Internal Medicine

## 2019-02-08 ENCOUNTER — Encounter: Payer: Self-pay | Admitting: Internal Medicine

## 2019-02-08 VITALS — BP 110/60 | HR 92 | Temp 98.0°F | Ht 66.0 in | Wt 196.0 lb

## 2019-02-08 DIAGNOSIS — E8881 Metabolic syndrome: Secondary | ICD-10-CM

## 2019-02-08 DIAGNOSIS — Z20828 Contact with and (suspected) exposure to other viral communicable diseases: Secondary | ICD-10-CM | POA: Diagnosis not present

## 2019-02-08 DIAGNOSIS — H903 Sensorineural hearing loss, bilateral: Secondary | ICD-10-CM | POA: Diagnosis not present

## 2019-02-08 DIAGNOSIS — J0100 Acute maxillary sinusitis, unspecified: Secondary | ICD-10-CM | POA: Diagnosis not present

## 2019-02-08 DIAGNOSIS — I10 Essential (primary) hypertension: Secondary | ICD-10-CM

## 2019-02-08 DIAGNOSIS — Z20822 Contact with and (suspected) exposure to covid-19: Secondary | ICD-10-CM

## 2019-02-08 DIAGNOSIS — E119 Type 2 diabetes mellitus without complications: Secondary | ICD-10-CM

## 2019-02-08 DIAGNOSIS — E785 Hyperlipidemia, unspecified: Secondary | ICD-10-CM

## 2019-02-08 DIAGNOSIS — Z6831 Body mass index (BMI) 31.0-31.9, adult: Secondary | ICD-10-CM

## 2019-02-08 DIAGNOSIS — E1169 Type 2 diabetes mellitus with other specified complication: Secondary | ICD-10-CM

## 2019-02-08 DIAGNOSIS — Z8659 Personal history of other mental and behavioral disorders: Secondary | ICD-10-CM

## 2019-02-08 MED ORDER — AMOXICILLIN 500 MG PO CAPS: 500.0000 mg | ORAL_CAPSULE | Freq: Three times a day (TID) | ORAL | 0 refills | Status: DC

## 2019-02-08 NOTE — Patient Instructions (Addendum)
It was a pleasure to see you today. Labs drawn and pending.Amoxicillin 500 mg 3 times a day for 10 days for sinusitis. Watch diet and get more exercise. RTC in 6 months.

## 2019-02-08 NOTE — Progress Notes (Signed)
   Subjective:    Patient ID: Terry Wilson, female    DOB: [birth year], 73 y.o.   MRN: [id number]  HPI  73 year old Female for 6 month recheck.  She has a history of diabetes mellitus, hearing loss, metabolic syndrome, hyperlipidemia, anxiety, essential hypertension, allergic rhinitis.  Not on diabetic medication. Says accuchecks have improved lately.  In April 2019 weight 212 pounds and now weighs 196 pounds.      Review of Systems- complains of maxillary sinus pressure and congestion. Remains hard of hearing. Declines flu vaccine     Objective:   Physical Exam BMI of 31.64.  I am pleased with her weight loss.  Skin warm and dry.  Nodes none.  TMs clear.  Neck is supple.  Pharynx not examined due to COVID-19.  Chest is clear to auscultation without rales or wheezing.  Cardiac exam regular rate and rhythm normal S1 and S2.  No carotid bruits.  No lower extremity edema.  She remains hard of hearing.       Assessment & Plan:  Controlled type 2 diabetes-labs drawn and pending  Hyperlipidemia on Crestor 10 mg daily-labs pending  Patients thinks she had Covid in February. Blood drawn today for SARS COVID-19 antibody  Essential hypertension stable.  Blood pressure 110/60.  Is on losartan, HCTZ, amlodipine.  Acute maxillary sinusitis- treat with 10 day course of Amoxicillin 500 mg 3 times a day.  BMI 31-continue diet and exercise efforts.  History of anxiety treated with Klonopin twice daily.  HTN- stable at 110/60.  Continue same regimen.  BMI 31- encourage more exercise- watch diet  Sensorineural hearing loss-hearing aids have been recommended

## 2019-02-09 LAB — SAR COV2 SEROLOGY (COVID19)AB(IGG),IA: SARS CoV2 AB IGG: NEGATIVE

## 2019-02-09 LAB — LIPID PANEL
Cholesterol: 164 mg/dL (ref <200)
HDL: 64 mg/dL (ref >=50)
LDL Cholesterol (Calc): 80 mg/dL (calc)
Non-HDL Cholesterol (Calc): 100 mg/dL (calc) (ref <130)
Total CHOL/HDL Ratio: 2.6 (calc) (ref <5.0)
Triglycerides: 104 mg/dL (ref <150)

## 2019-02-09 LAB — HEPATIC FUNCTION PANEL
AG Ratio: 1.4 (calc) (ref 1.0–2.5)
ALT: 9 U/L (ref 6–29)
AST: 12 U/L (ref 10–35)
Albumin: 4.4 g/dL (ref 3.6–5.1)
Alkaline phosphatase (APISO): 74 U/L (ref 37–153)
Bilirubin, Direct: 0.1 mg/dL (ref 0.0–0.2)
Globulin: 3.1 g/dL (calc) (ref 1.9–3.7)
Indirect Bilirubin: 0.4 mg/dL (calc) (ref 0.2–1.2)
Total Bilirubin: 0.5 mg/dL (ref 0.2–1.2)
Total Protein: 7.5 g/dL (ref 6.1–8.1)

## 2019-02-09 LAB — HEMOGLOBIN A1C
Hgb A1c MFr Bld: 7.5 % of total Hgb — ABNORMAL HIGH (ref <5.7)
Mean Plasma Glucose: 169 (calc)
eAG (mmol/L): 9.3 (calc)

## 2019-03-04 ENCOUNTER — Other Ambulatory Visit: Payer: Self-pay | Admitting: Internal Medicine

## 2019-04-12 ENCOUNTER — Other Ambulatory Visit: Payer: Self-pay | Admitting: Internal Medicine

## 2019-06-07 ENCOUNTER — Other Ambulatory Visit: Payer: Self-pay | Admitting: Internal Medicine

## 2019-06-28 ENCOUNTER — Other Ambulatory Visit: Payer: Self-pay | Admitting: Internal Medicine

## 2019-08-09 ENCOUNTER — Other Ambulatory Visit: Payer: Medicare Other | Admitting: Internal Medicine

## 2019-08-09 ENCOUNTER — Other Ambulatory Visit: Payer: Self-pay

## 2019-08-09 ENCOUNTER — Ambulatory Visit (INDEPENDENT_AMBULATORY_CARE_PROVIDER_SITE_OTHER): Payer: Medicare Other | Admitting: Internal Medicine

## 2019-08-09 ENCOUNTER — Encounter: Payer: Self-pay | Admitting: Internal Medicine

## 2019-08-09 VITALS — BP 130/60 | HR 74 | Ht 66.0 in | Wt 204.0 lb

## 2019-08-09 DIAGNOSIS — M2550 Pain in unspecified joint: Secondary | ICD-10-CM

## 2019-08-09 DIAGNOSIS — M19049 Primary osteoarthritis, unspecified hand: Secondary | ICD-10-CM

## 2019-08-09 DIAGNOSIS — F411 Generalized anxiety disorder: Secondary | ICD-10-CM

## 2019-08-09 DIAGNOSIS — E8881 Metabolic syndrome: Secondary | ICD-10-CM

## 2019-08-09 DIAGNOSIS — J0100 Acute maxillary sinusitis, unspecified: Secondary | ICD-10-CM

## 2019-08-09 DIAGNOSIS — E785 Hyperlipidemia, unspecified: Secondary | ICD-10-CM

## 2019-08-09 DIAGNOSIS — Z0000 Encounter for general adult medical examination without abnormal findings: Secondary | ICD-10-CM

## 2019-08-09 DIAGNOSIS — H903 Sensorineural hearing loss, bilateral: Secondary | ICD-10-CM

## 2019-08-09 DIAGNOSIS — Z1329 Encounter for screening for other suspected endocrine disorder: Secondary | ICD-10-CM

## 2019-08-09 DIAGNOSIS — I10 Essential (primary) hypertension: Secondary | ICD-10-CM

## 2019-08-09 DIAGNOSIS — Z6832 Body mass index (BMI) 32.0-32.9, adult: Secondary | ICD-10-CM

## 2019-08-09 DIAGNOSIS — M7918 Myalgia, other site: Secondary | ICD-10-CM

## 2019-08-09 DIAGNOSIS — E119 Type 2 diabetes mellitus without complications: Secondary | ICD-10-CM

## 2019-08-09 LAB — POCT URINALYSIS DIPSTICK
Appearance: NEGATIVE
Bilirubin, UA: NEGATIVE
Blood, UA: NEGATIVE
Glucose, UA: NEGATIVE
Ketones, UA: NEGATIVE
Leukocytes, UA: NEGATIVE
Nitrite, UA: NEGATIVE
Odor: NEGATIVE
Protein, UA: NEGATIVE
Spec Grav, UA: 1.01 (ref 1.010–1.025)
Urobilinogen, UA: 0.2 E.U./dL
pH, UA: 7.5 (ref 5.0–8.0)

## 2019-08-09 MED ORDER — AMOXICILLIN 500 MG PO CAPS: 500.0000 mg | ORAL_CAPSULE | Freq: Three times a day (TID) | ORAL | 0 refills | Status: DC

## 2019-08-09 MED ORDER — PREDNISONE 10 MG PO TABS
ORAL_TABLET | ORAL | 0 refills | Status: DC
Start: 2019-08-09 — End: 2020-06-02

## 2019-08-09 NOTE — Progress Notes (Signed)
Subjective:    Patient ID: Terry Wilson, female    DOB: 12-15-1945, 74 y.o.   MRN: [id number]  HPI 74 year old Female for health maintenance exam, evaluation of medical issues, and Medicare Wellness visit  She has a history of hearing loss.  Diabetes mellitus, metabolic syndrome, hyperlipidemia, anxiety, essential hypertension and allergic rhinitis.  History of vitamin D deficiency.  History of arthropathy right shoulder seen by orthopedist.  Has refused flu vaccine.   Has had 2 pneumococcal vaccines.  Had Zostavax vaccine in 2013.  Declines COVID-19 vaccine.  Intolerant of Naprosyn, Clinoril, Macrobid.  Cannot take sulfa.  Is allergic to tetanus toxoid apparently.  Levaquin causes diarrhea and joint swelling.  Has annual diabetic eye exam by Dr. Hazle Quant each fall.  History of facial urticaria and has seen Dr. Margo Aye.  Past medical history: Bilateral tubal ligation in the 1980s.  Fractured right ankle 1991.  History of fractured right shoulder 2008.  Cervical polyp removed in 2004.  Social history: She is married.  1 adult son.  Husband with history of prostate cancer who has had radioactive seed implants.  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    Family history: Father died at age 87.  He lost the ability to walk with history of back issues and arthritis.  He was complaining of unilateral leg pain prior to his demise and perhaps had a DVT or PE.  Mother died of complications of pick's disease.  2 sisters in good health.  Son has  history of alcoholism and diabetes.  Wears hearing aids and still has hearing issues.  Complaining of maxillary sinus pressure and some slight discolored nasal drainage.  Review of Systems  Constitutional: Negative.   Respiratory: Negative.   Cardiovascular: Negative.   Gastrointestinal: Negative.   Genitourinary: Negative.   Psychiatric/Behavioral:       History of anxiety       Objective:    Physical Exam Vitals reviewed.  Constitutional:      Appearance: She is obese.  HENT:     Head: Normocephalic and atraumatic.     Right Ear: Tympanic membrane normal.     Left Ear: Tympanic membrane normal.  Eyes:     Conjunctiva/sclera: Conjunctivae normal.     Pupils: Pupils are equal, round, and reactive to light.  Neck:     Thyroid: No thyromegaly.  Cardiovascular:     Rate and Rhythm: Normal rate and regular rhythm.     Heart sounds: Normal heart sounds. No murmur heard.   Pulmonary:     Effort: Pulmonary effort is normal. No respiratory distress.     Breath sounds: Normal breath sounds.  Abdominal:     General: Bowel sounds are normal. There is no distension.     Palpations: Abdomen is soft.     Tenderness: There is no abdominal tenderness. There is no rebound.  Musculoskeletal:     Cervical back: Neck supple.  Lymphadenopathy:     Cervical: No cervical adenopathy.  Skin:    General: Skin is warm and dry.  Neurological:     General: No focal deficit present.     Mental Status: She is alert and oriented to person, place, and time.  Psychiatric:        Mood and Affect: Mood normal.        Behavior: Behavior normal.           Assessment & Plan:  Acute maxillary sinusitis to  be treated with amoxicillin 500 mg 3 times a day for 10 days.  Continue to take Allegra as needed for allergy symptoms.  Recommend Flonase nasal spray.  Also given short tapering course of prednisone starting with 60 mg and decreasing by 10 mg daily over 6 days.  Hyperlipidemia treated with Crestor and lipid panel is normal  Musculoskeletal pain treated with Mobic  Allergic rhinitis treated with Flonase and Allegra  History of reactive airways disease treated with albuterol  Morbid obesity-TSH is normal.  BMI is 32.93.  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  Hypertension treated with  Norvasc and HCTZ as well as losartan.  Blood pressure stable at 130/60  Diabetes mellitus-hemoglobin A1c 7.2%-does not want to be on glucose lowering medication.  Says she will work on diet and exercise.  Plan: Take amoxicillin and prednisone as directed for acute maxillary sinusitis.  Discussed Dr. Migdalia Dk clinic.  She wants to continue to work on diet and exercise on her own.  May take anti-inflammatory medication for hand arthritis.  Continue Crestor for hyperlipidemia.  Return in 6 months.

## 2019-08-10 LAB — COMPLETE METABOLIC PANEL WITH GFR
AG Ratio: 1.7 (calc) (ref 1.0–2.5)
ALT: 14 U/L (ref 6–29)
AST: 14 U/L (ref 10–35)
Albumin: 4.8 g/dL (ref 3.6–5.1)
Alkaline phosphatase (APISO): 71 U/L (ref 37–153)
BUN: 19 mg/dL (ref 7–25)
CO2: 30 mmol/L (ref 20–32)
Calcium: 10.5 mg/dL — ABNORMAL HIGH (ref 8.6–10.4)
Chloride: 103 mmol/L (ref 98–110)
Creat: 0.7 mg/dL (ref 0.60–0.93)
GFR, Est African American: 100 mL/min/{1.73_m2} (ref >=60)
GFR, Est Non African American: 87 mL/min/{1.73_m2} (ref >=60)
Globulin: 2.8 g/dL (calc) (ref 1.9–3.7)
Glucose, Bld: 154 mg/dL — ABNORMAL HIGH (ref 65–99)
Potassium: 5.5 mmol/L — ABNORMAL HIGH (ref 3.5–5.3)
Sodium: 142 mmol/L (ref 135–146)
Total Bilirubin: 0.7 mg/dL (ref 0.2–1.2)
Total Protein: 7.6 g/dL (ref 6.1–8.1)

## 2019-08-10 LAB — CBC WITH DIFFERENTIAL/PLATELET
Absolute Monocytes: 506 cells/uL (ref 200–950)
Basophils Absolute: 58 cells/uL (ref 0–200)
Basophils Relative: 0.9 %
Eosinophils Absolute: 250 cells/uL (ref 15–500)
Eosinophils Relative: 3.9 %
HCT: 42.3 % (ref 35.0–45.0)
Hemoglobin: 14.3 g/dL (ref 11.7–15.5)
Lymphs Abs: 2208 cells/uL (ref 850–3900)
MCH: 31.8 pg (ref 27.0–33.0)
MCHC: 33.8 g/dL (ref 32.0–36.0)
MCV: 94.2 fL (ref 80.0–100.0)
MPV: 11 fL (ref 7.5–12.5)
Monocytes Relative: 7.9 %
Neutro Abs: 3379 cells/uL (ref 1500–7800)
Neutrophils Relative %: 52.8 %
Platelets: 240 10*3/uL (ref 140–400)
RBC: 4.49 10*6/uL (ref 3.80–5.10)
RDW: 11.3 % (ref 11.0–15.0)
Total Lymphocyte: 34.5 %
WBC: 6.4 10*3/uL (ref 3.8–10.8)

## 2019-08-10 LAB — TSH: TSH: 1.52 mIU/L (ref 0.40–4.50)

## 2019-08-10 LAB — HEMOGLOBIN A1C
Hgb A1c MFr Bld: 7.2 % of total Hgb — ABNORMAL HIGH (ref <5.7)
Mean Plasma Glucose: 160 (calc)
eAG (mmol/L): 8.9 (calc)

## 2019-08-10 LAB — CYCLIC CITRUL PEPTIDE ANTIBODY, IGG: Cyclic Citrullin Peptide Ab: <16 UNITS

## 2019-08-10 LAB — LIPID PANEL
Cholesterol: 153 mg/dL (ref <200)
HDL: 62 mg/dL (ref >=50)
LDL Cholesterol (Calc): 75 mg/dL (calc)
Non-HDL Cholesterol (Calc): 91 mg/dL (calc) (ref <130)
Total CHOL/HDL Ratio: 2.5 (calc) (ref <5.0)
Triglycerides: 80 mg/dL (ref <150)

## 2019-08-14 ENCOUNTER — Telehealth: Payer: Self-pay | Admitting: Internal Medicine

## 2019-08-14 NOTE — Telephone Encounter (Signed)
Terry Wilson [phone]  Darl Pikes called to see if she needs to be fasting for the labs that she needs to come in for this week or next.

## 2019-08-16 ENCOUNTER — Other Ambulatory Visit: Payer: Self-pay

## 2019-08-16 ENCOUNTER — Other Ambulatory Visit: Payer: Medicare Other | Admitting: Internal Medicine

## 2019-08-16 DIAGNOSIS — E875 Hyperkalemia: Secondary | ICD-10-CM

## 2019-08-16 LAB — POTASSIUM: Potassium: 4.3 mmol/L (ref 3.5–5.3)

## 2019-08-22 NOTE — Patient Instructions (Addendum)
Please try to walk some if you can daily.  Take amoxicillin and prednisone as directed for sinusitis.  Try to lose some weight.  Continue Crestor for hyperlipidemia and antihypertensive medication.  Follow-up in 6 months.

## 2019-10-27 ENCOUNTER — Other Ambulatory Visit: Payer: Self-pay | Admitting: Internal Medicine

## 2019-11-17 ENCOUNTER — Other Ambulatory Visit: Payer: Self-pay | Admitting: Internal Medicine

## 2019-11-21 LAB — HM MAMMOGRAPHY

## 2019-11-23 ENCOUNTER — Encounter: Payer: Self-pay | Admitting: Internal Medicine

## 2020-01-10 ENCOUNTER — Other Ambulatory Visit: Payer: Self-pay | Admitting: Internal Medicine

## 2020-01-29 ENCOUNTER — Other Ambulatory Visit: Payer: Self-pay | Admitting: Internal Medicine

## 2020-02-05 ENCOUNTER — Encounter: Payer: Self-pay | Admitting: Internal Medicine

## 2020-02-05 ENCOUNTER — Ambulatory Visit (INDEPENDENT_AMBULATORY_CARE_PROVIDER_SITE_OTHER): Payer: Medicare Other | Admitting: Internal Medicine

## 2020-02-05 ENCOUNTER — Other Ambulatory Visit: Payer: Self-pay

## 2020-02-05 VITALS — BP 132/64 | HR 73 | Ht 66.0 in | Wt 198.0 lb

## 2020-02-05 DIAGNOSIS — M19049 Primary osteoarthritis, unspecified hand: Secondary | ICD-10-CM

## 2020-02-05 DIAGNOSIS — E1169 Type 2 diabetes mellitus with other specified complication: Secondary | ICD-10-CM

## 2020-02-05 DIAGNOSIS — E785 Hyperlipidemia, unspecified: Secondary | ICD-10-CM | POA: Diagnosis not present

## 2020-02-05 DIAGNOSIS — Z8659 Personal history of other mental and behavioral disorders: Secondary | ICD-10-CM

## 2020-02-05 DIAGNOSIS — Z6831 Body mass index (BMI) 31.0-31.9, adult: Secondary | ICD-10-CM

## 2020-02-05 DIAGNOSIS — E119 Type 2 diabetes mellitus without complications: Secondary | ICD-10-CM

## 2020-02-05 DIAGNOSIS — E8881 Metabolic syndrome: Secondary | ICD-10-CM

## 2020-02-05 DIAGNOSIS — H903 Sensorineural hearing loss, bilateral: Secondary | ICD-10-CM

## 2020-02-05 NOTE — Patient Instructions (Signed)
Labs are drawn and pending with further instructions to follow-up on review.  Continue current medications and return in 6 months for health maintenance exam.  Covid and flu vaccines declined.

## 2020-02-05 NOTE — Progress Notes (Signed)
° °  Subjective:    Patient ID: Terry Wilson, female    DOB: 23-Mar-1945, 74 y.o.   MRN: [id number]  HPI 74 year old Female seen for 6 month recheck. Hx diabetes mellitus and glucose is running 130-150 mostly, she says. Eating out some and ate well for Thanksgiving.  She has a history of hearing loss, diabetes mellitus, metabolic syndrome, hyperlipidemia, anxiety, essential hypertension and allergic rhinitis.  History of vitamin D deficiency.  Refuses flu vaccine.  Has declined COVID-19 vaccines.  Intolerant of Naprosyn, Clinoril, Macrobid.  Cannot take Sulfa.  Says she is allergic to tetanus toxoid.  Levaquin causes diarrhea and joint swelling.  Has annual diabetic eye exam by Dr. Hazle Quant each Fall  Despite wearing hearing aids she still has hearing issues.  Fasting labs are drawn and are pending including hemoglobin A1c, liver functions and lipid panel.  No new complaints.  Social history: Married.  1 adult son.  Retired from [employer].  Non-smoker.  Does not consume alcohol.    Review of Systems declines Covid and Influenza vaccines     Objective:   Physical Exam BP 132/64 BMI 31.96 pulse 73 regular, respiratory rate 12 pulse oximetry 98%.  Skin warm and dry.  Neck is supple without JVD thyromegaly or carotid bruits.  No thyromegaly.  Chest is clear to auscultation without rales or wheezing.  Cardiac exam regular rate and rhythm normal S1 and S2 without murmurs or gallops.  No lower extremity pitting edema.  Affect thought and judgment are within normal limits.       Assessment & Plan:  Type 2 diabetes mellitus-hemoglobin A1c pending  BMI 31.96-has lost 6 pounds since June.  Hyperlipidemia treated with Crestor.  Lipid panel is pending.  History of allergic rhinitis treated with Flonase and Allegra.  Hearing loss-wears hearing aids  Essential hypertension stable on Norvasc, losartan and HCTZ.  History of allergic rhinitis treated with Flonase and  Allegra  Musculoskeletal pain treated with Mobic  Plan: We will review lab studies and advise patient of the results.  Otherwise return in 6 months for health maintenance exam and fasting labs as well as Medicare wellness visit.  She declines Covid vaccine and flu vaccines.

## 2020-02-06 LAB — LIPID PANEL
Cholesterol: 170 mg/dL (ref <200)
HDL: 67 mg/dL (ref >=50)
LDL Cholesterol (Calc): 84 mg/dL (calc)
Non-HDL Cholesterol (Calc): 103 mg/dL (calc) (ref <130)
Total CHOL/HDL Ratio: 2.5 (calc) (ref <5.0)
Triglycerides: 93 mg/dL (ref <150)

## 2020-02-06 LAB — HEPATIC FUNCTION PANEL
AG Ratio: 1.6 (calc) (ref 1.0–2.5)
ALT: 11 U/L (ref 6–29)
AST: 15 U/L (ref 10–35)
Albumin: 4.5 g/dL (ref 3.6–5.1)
Alkaline phosphatase (APISO): 77 U/L (ref 37–153)
Bilirubin, Direct: 0.1 mg/dL (ref 0.0–0.2)
Globulin: 2.9 g/dL (calc) (ref 1.9–3.7)
Indirect Bilirubin: 0.7 mg/dL (calc) (ref 0.2–1.2)
Total Bilirubin: 0.8 mg/dL (ref 0.2–1.2)
Total Protein: 7.4 g/dL (ref 6.1–8.1)

## 2020-02-06 LAB — HEMOGLOBIN A1C
Hgb A1c MFr Bld: 7.9 % of total Hgb — ABNORMAL HIGH (ref <5.7)
Mean Plasma Glucose: 180 mg/dL
eAG (mmol/L): 10 mmol/L

## 2020-02-06 NOTE — Progress Notes (Signed)
Hgb AIC has gone up. Watch diet and walk some.

## 2020-02-18 LAB — HM DIABETES EYE EXAM

## 2020-02-29 ENCOUNTER — Encounter: Payer: Self-pay | Admitting: Internal Medicine

## 2020-03-18 ENCOUNTER — Other Ambulatory Visit: Payer: Self-pay | Admitting: Internal Medicine

## 2020-06-02 ENCOUNTER — Other Ambulatory Visit: Payer: Self-pay

## 2020-06-02 ENCOUNTER — Telehealth: Payer: Self-pay | Admitting: Internal Medicine

## 2020-06-02 ENCOUNTER — Ambulatory Visit (INDEPENDENT_AMBULATORY_CARE_PROVIDER_SITE_OTHER): Payer: Medicare Other | Admitting: Internal Medicine

## 2020-06-02 ENCOUNTER — Encounter: Payer: Self-pay | Admitting: Internal Medicine

## 2020-06-02 VITALS — HR 78 | Temp 98.3°F

## 2020-06-02 DIAGNOSIS — I10 Essential (primary) hypertension: Secondary | ICD-10-CM | POA: Diagnosis not present

## 2020-06-02 DIAGNOSIS — E119 Type 2 diabetes mellitus without complications: Secondary | ICD-10-CM

## 2020-06-02 DIAGNOSIS — H903 Sensorineural hearing loss, bilateral: Secondary | ICD-10-CM | POA: Diagnosis not present

## 2020-06-02 DIAGNOSIS — J069 Acute upper respiratory infection, unspecified: Secondary | ICD-10-CM | POA: Diagnosis not present

## 2020-06-02 DIAGNOSIS — R059 Cough, unspecified: Secondary | ICD-10-CM

## 2020-06-02 MED ORDER — PREDNISONE 10 MG PO TABS: ORAL_TABLET | ORAL | 0 refills | Status: DC

## 2020-06-02 MED ORDER — HYDROCOD POLST-CPM POLST ER 10-8 MG/5ML PO SUER: 5.0000 mL | Freq: Two times a day (BID) | ORAL | 0 refills | Status: DC | PRN

## 2020-06-02 MED ORDER — AMOXICILLIN 500 MG PO CAPS: 500.0000 mg | ORAL_CAPSULE | Freq: Three times a day (TID) | ORAL | 0 refills | Status: DC

## 2020-06-02 MED ORDER — HYDROCODONE-HOMATROPINE 5-1.5 MG/5ML PO SYRP: 5.0000 mL | ORAL_SOLUTION | Freq: Three times a day (TID) | ORAL | 0 refills | Status: DC | PRN

## 2020-06-02 NOTE — Patient Instructions (Addendum)
COVID-19 PCR test obtained.  Take amoxicillin 500 mg 3 times a day for 10 days.  Tussionex 1 teaspoon by mouth every 12 hours as needed for cough and sore throat pain.  Rest and drink fluids.

## 2020-06-02 NOTE — Progress Notes (Signed)
   Subjective:    Patient ID: Terry Wilson, female    DOB: [birth year], 75 y.o.   MRN: [id number]  HPI 75 year old Female seen with protracted respiratory infection symptoms.  Has had itching in eyes nose and throat.  Some discolored sputum with coughing.  Seems to be worse every time she goes out the door.  Symptoms of been present for a few weeks.  Does not feel like she is getting better.  She feels this is seasonal allergies due to pollen and not COVID-19.  She has not done a test for Covid at home because she does not have any home test.  No known exposure to COVID-19.  No vaccines on file for COVID-19.  Complains of some swollen glands in the anterior cervical area.  History of hearing loss and wears bilateral hearing aids.  Has diabetes mellitus, metabolic syndrome, hyperlipidemia, anxiety, essential hypertension and allergic rhinitis.  Social history: She is married.  Lives with her husband.  No one else at home.  Does not smoke or consume alcohol.    Review of Systems see above- no travel history. No nausea or vomiting. No headache or dysgeusia. No fever ot chills.     Objective:   Physical Exam Afebrile. VS reviewed RR normal.  Pharynx is slightly injected.  Wears bilateral hearing aids.  They were removed and TMs appear to be clear.  Neck is supple.  Chest is clear to auscultation without rales or wheezing.  She is in no acute distress.  Sounds nasally congested when she speaks.  COVID-19 PCR test obtained by nasal swab.       Assessment & Plan:  Acute upper respiratory infection  Rule out COVID-19 infection  Plan: Amoxicillin 500 mg 3 times a day for 10 days.  Initially prescribed Hycodan but pharmacy does not have that in stock and we have given her instead Tussionex 1 teaspoon every 12 hours as needed for cough and sore throat pain.  Rest and drink fluids.  COVID-19 PCR test is pending.

## 2020-06-02 NOTE — Telephone Encounter (Signed)
Scheduled

## 2020-06-02 NOTE — Telephone Encounter (Signed)
Terry Wilson [phone]  Terry Wilson called to say she has itching in her eyes, nose and throat, coughing up stuff especially in the mornings and every time she goes out the door. She states this is her seasonal allergies because of the pollen and would like to be seen or have something called in. No fever, No Vaccines, No exposures to COVID that she knows of, and she has not tested for COVID, does not have any home test.

## 2020-06-02 NOTE — Telephone Encounter (Signed)
Lobby visit 

## 2020-06-04 LAB — SARS-COV-2 RNA,(COVID-19) QUALITATIVE NAAT: SARS CoV2 RNA: NOT DETECTED

## 2020-06-06 ENCOUNTER — Other Ambulatory Visit: Payer: Self-pay | Admitting: Internal Medicine

## 2020-06-10 ENCOUNTER — Other Ambulatory Visit: Payer: Self-pay | Admitting: Internal Medicine

## 2020-08-11 ENCOUNTER — Other Ambulatory Visit: Payer: Self-pay

## 2020-08-11 ENCOUNTER — Ambulatory Visit (INDEPENDENT_AMBULATORY_CARE_PROVIDER_SITE_OTHER): Payer: Medicare Other | Admitting: Internal Medicine

## 2020-08-11 ENCOUNTER — Encounter: Payer: Self-pay | Admitting: Internal Medicine

## 2020-08-11 VITALS — BP 150/80 | HR 77 | Ht 66.0 in | Wt 205.0 lb

## 2020-08-11 DIAGNOSIS — Z6833 Body mass index (BMI) 33.0-33.9, adult: Secondary | ICD-10-CM

## 2020-08-11 DIAGNOSIS — E119 Type 2 diabetes mellitus without complications: Secondary | ICD-10-CM | POA: Diagnosis not present

## 2020-08-11 DIAGNOSIS — H903 Sensorineural hearing loss, bilateral: Secondary | ICD-10-CM

## 2020-08-11 DIAGNOSIS — M7918 Myalgia, other site: Secondary | ICD-10-CM | POA: Diagnosis not present

## 2020-08-11 DIAGNOSIS — Z0000 Encounter for general adult medical examination without abnormal findings: Secondary | ICD-10-CM | POA: Diagnosis not present

## 2020-08-11 DIAGNOSIS — I10 Essential (primary) hypertension: Secondary | ICD-10-CM

## 2020-08-11 DIAGNOSIS — Z8659 Personal history of other mental and behavioral disorders: Secondary | ICD-10-CM

## 2020-08-11 DIAGNOSIS — J301 Allergic rhinitis due to pollen: Secondary | ICD-10-CM

## 2020-08-11 DIAGNOSIS — E785 Hyperlipidemia, unspecified: Secondary | ICD-10-CM

## 2020-08-11 DIAGNOSIS — M2550 Pain in unspecified joint: Secondary | ICD-10-CM

## 2020-08-11 DIAGNOSIS — J0100 Acute maxillary sinusitis, unspecified: Secondary | ICD-10-CM

## 2020-08-11 DIAGNOSIS — E1169 Type 2 diabetes mellitus with other specified complication: Secondary | ICD-10-CM

## 2020-08-11 DIAGNOSIS — E8881 Metabolic syndrome: Secondary | ICD-10-CM

## 2020-08-11 DIAGNOSIS — F411 Generalized anxiety disorder: Secondary | ICD-10-CM | POA: Diagnosis not present

## 2020-08-11 LAB — POCT URINALYSIS DIPSTICK
Appearance: NEGATIVE
Bilirubin, UA: NEGATIVE
Blood, UA: NEGATIVE
Glucose, UA: NEGATIVE
Ketones, UA: NEGATIVE
Leukocytes, UA: NEGATIVE
Nitrite, UA: NEGATIVE
Odor: NEGATIVE
Protein, UA: NEGATIVE
Spec Grav, UA: 1.015 (ref 1.010–1.025)
Urobilinogen, UA: 0.2 E.U./dL
pH, UA: 6.5 (ref 5.0–8.0)

## 2020-08-11 MED ORDER — GLUCOSE BLOOD VI STRP: ORAL_STRIP | 3 refills | Status: AC

## 2020-08-11 MED ORDER — AMOXICILLIN 500 MG PO CAPS: 500.0000 mg | ORAL_CAPSULE | Freq: Three times a day (TID) | ORAL | 0 refills | Status: AC

## 2020-08-11 NOTE — Patient Instructions (Signed)
Take Klonopin one tab at bedtime

## 2020-08-11 NOTE — Progress Notes (Signed)
Subjective:    Patient ID: Terry Wilson, female    DOB: 1945-04-23, 75 y.o.   MRN: [id number]  HPI 75 year old Female for health maintenance exam and evaluation of medical issues. Hard of hearing and saw audiologist. Had check of hearing aids which were working OK. Was told by Audiologist that medication taken in the past could have caused hearing loss.Offered referral to ENT to see if candidate for cochlear implant but she declined.  She has a history of diabetes mellitus, metabolic syndrome, hyperlipidemia, anxiety, essential hypertension and allergic rhinitis.  History of vitamin D deficiency.  History of arthropathy right shoulder previously seen by orthopedist.  Has declined COVID vaccines.  Pneumococcal immunizations are up-to-date as is his tetanus immunization.  Intolerant of Naprosyn, Clinoril, Macrobid.  Cannot tolerate Sulfa.  Is allergic to tetanus toxoid.  Levaquin causes diarrhea and joint swelling.  Has annual diabetic eye exam by Dr. Hazle Quant each Fall.  History of facial urticaria and is seeing Dr. Margo Aye.  Social history: She is married.  1 adult son.  Husband is retired with history of prostate cancer but doing well.  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  She does not smoke or consume alcohol.  Family history: Father died at age 64.  He lost the ability to walk with history of back issues and arthritis.  He was complaining of unilateral leg pain prior to his demise and perhaps had a DVT or PE.  Mother died of complications of Picks disease.  2 sisters in good health.  Son with history of alcoholism and diabetes.  Today is complaining of maxillary sinus pressure and some discolored nasal drainage.  Review of Systems glucose has been elevated especially since had prednisone in April for respiratory infection. Does not want to take metformin -complaining of more respiratory infection symptoms today but no fever      Objective:   Physical Exam Blood pressure 150/80 but she is slightly anxious.  Pulse 77 pulse oximetry 96% weight 205 pounds BMI 33.09  Skin: Warm and dry.  No cervical adenopathy.  TMs are clear.  No carotid bruits.  No thyromegaly.  Chest is clear to auscultation.  Neck is supple.  Cardiac exam regular rate and rhythm.  Breasts are without masses.  Abdomen soft nondistended without hepatosplenomegaly masses or tenderness.  Bimanual exam is normal.         Assessment & Plan:  Acute maxillary sinusitis-amoxicillin 500 mg 3 times a day for 10 days has been prescribed  Hyperlipidemia treated with Crestor.  Lipid panel is pending  Musculoskeletal pain treated with Mobic  Hearing loss-wears bilateral hearing aids  Allergic rhinitis treated with Flonase and Allegra  History of reactive airways disease treated with albuterol  Morbid obesity-BMI 33.0 noted  Hand arthritis-has been tested for rheumatoid arthritis and had negative CCP  Hypertension treated with Norvasc HCTZ and losartan.  Blood pressure slightly elevated today but she is a bit anxious.  Continue to monitor at home.  Diabetes mellitus-hemoglobin A1c pending.  Plan: Reviewed lab studies drawn today and make further recommendations.  Otherwise return in 6 months.  Has not wanted to consider Dr. Franc[facility] for weight loss.   Subjective:   Patient presents for Medicare Annual/Subsequent preventive examination.  Review Past Medical/Family/Social: See above   Risk Factors  Current exercise habits: Not a lot of exercise Dietary issues discussed: He has  Cardiac risk factors: Obesity, diabetes, hyperlipidemia  Depression Screen  (  Note: if answer to either of the following is "Yes", a more complete depression screening is indicated)   Over the past two weeks, have you felt down, depressed or hopeless? No  Over the past two weeks, have you felt little interest or pleasure in doing things? No Have you lost  interest or pleasure in daily life? No Do you often feel hopeless? No Do you cry easily over simple problems? No   Activities of Daily Living  In your present state of health, do you have any difficulty performing the following activities?:   Driving? No  Managing money? No  Feeding yourself? No  Getting from bed to chair? No  Climbing a flight of stairs? No  Preparing food and eating?: No  Bathing or showering? No  Getting dressed: No  Getting to the toilet? No  Using the toilet:No  Moving around from place to place: No  In the past year have you fallen or had a near fall?:No  Are you sexually active?  Yes Do you have more than one partner? No   Hearing Difficulties: Hands despite wearing her hearing a Do you often ask people to speak up or repeat themselves?  He has often Do you experience ringing or noises in your ears? No  Do you have difficulty understanding soft or whispered voices?  Yes despite wearing a hearing aid Do you feel that you have a problem with memory? No Do you often misplace items? No    Home Safety:  Do you have a smoke alarm at your residence? Yes Do you have grab bars in the bathroom? Do you have throw rugs in your house?   Cognitive Testing  Alert? Yes Normal Appearance?Yes  Oriented to person? Yes Place? Yes  Time? Yes  Recall of three objects? Yes  Can perform simple calculations? Yes  Displays appropriate judgment?Yes  Can read the correct time from a watch face?Yes   List the Names of Other Physician/Practitioners you currently use:  See referral list for the physicians patient is currently seeing.     Review of Systems: See above   Objective:     General appearance: Appears stated age and obese  Head: Normocephalic, without obvious abnormality, atraumatic  Eyes: conj clear, EOMi PEERLA  Ears: normal TM's and external ear canals both ears  Nose: Nares normal. Septum midline. Mucosa normal. No drainage or sinus tenderness.   Throat: lips, mucosa, and tongue normal; teeth and gums normal  Neck: no adenopathy, no carotid bruit, no JVD, supple, symmetrical, trachea midline and thyroid not enlarged, symmetric, no tenderness/mass/nodules  No CVA tenderness.  Lungs: clear to auscultation bilaterally  Breasts: normal appearance, no masses or tenderness Heart: regular rate and rhythm, S1, S2 normal, no murmur, click, rub or gallop  Abdomen: soft, non-tender; bowel sounds normal; no masses, no organomegaly  Musculoskeletal: ROM normal in all joints, no crepitus, no deformity, Normal muscle strengthen. Back  is symmetric, no curvature. Skin: Skin color, texture, turgor normal. No rashes or lesions  Lymph nodes: Cervical, supraclavicular, and axillary nodes normal.  Neurologic: CN 2 -12 Normal, Normal symmetric reflexes. Normal coordination and gait  Psych: Alert & Oriented x 3, Mood appear stable.    Assessment:    Annual wellness medicare exam   Plan:    During the course of the visit the patient was educated and counseled about appropriate screening and preventive services including:   Declines COVID-vaccine     Patient Instructions (the written plan) was given to the  patient.  Medicare Attestation  I have personally reviewed:  The patient's medical and social history  Their use of alcohol, tobacco or illicit drugs  Their current medications and supplements  The patient's functional ability including ADLs,fall risks, home safety risks, cognitive, and hearing and visual impairment  Diet and physical activities  Evidence for depression or mood disorders  The patient's weight, height, BMI, and visual acuity have been recorded in the chart. I have made referrals, counseling, and provided education to the patient based on review of the above and I have provided the patient with a written personalized care plan for preventive services.

## 2020-08-12 LAB — COMPLETE METABOLIC PANEL WITH GFR
AG Ratio: 1.4 (calc) (ref 1.0–2.5)
ALT: 12 U/L (ref 6–29)
AST: 13 U/L (ref 10–35)
Albumin: 4.7 g/dL (ref 3.6–5.1)
Alkaline phosphatase (APISO): 76 U/L (ref 37–153)
BUN: 15 mg/dL (ref 7–25)
CO2: 34 mmol/L — ABNORMAL HIGH (ref 20–32)
Calcium: 10.6 mg/dL — ABNORMAL HIGH (ref 8.6–10.4)
Chloride: 99 mmol/L (ref 98–110)
Creat: 0.66 mg/dL (ref 0.60–0.93)
GFR, Est African American: 102 mL/min/{1.73_m2} (ref >=60)
GFR, Est Non African American: 88 mL/min/{1.73_m2} (ref >=60)
Globulin: 3.3 g/dL (calc) (ref 1.9–3.7)
Glucose, Bld: 181 mg/dL — ABNORMAL HIGH (ref 65–99)
Potassium: 4.5 mmol/L (ref 3.5–5.3)
Sodium: 141 mmol/L (ref 135–146)
Total Bilirubin: 0.7 mg/dL (ref 0.2–1.2)
Total Protein: 8 g/dL (ref 6.1–8.1)

## 2020-08-12 LAB — CBC WITH DIFFERENTIAL/PLATELET
Absolute Monocytes: 444 cells/uL (ref 200–950)
Basophils Absolute: 60 cells/uL (ref 0–200)
Basophils Relative: 1 %
Eosinophils Absolute: 138 cells/uL (ref 15–500)
Eosinophils Relative: 2.3 %
HCT: 42.5 % (ref 35.0–45.0)
Hemoglobin: 14.3 g/dL (ref 11.7–15.5)
Lymphs Abs: 2142 cells/uL (ref 850–3900)
MCH: 31.4 pg (ref 27.0–33.0)
MCHC: 33.6 g/dL (ref 32.0–36.0)
MCV: 93.2 fL (ref 80.0–100.0)
MPV: 11.2 fL (ref 7.5–12.5)
Monocytes Relative: 7.4 %
Neutro Abs: 3216 cells/uL (ref 1500–7800)
Neutrophils Relative %: 53.6 %
Platelets: 226 10*3/uL (ref 140–400)
RBC: 4.56 10*6/uL (ref 3.80–5.10)
RDW: 11.4 % (ref 11.0–15.0)
Total Lymphocyte: 35.7 %
WBC: 6 10*3/uL (ref 3.8–10.8)

## 2020-08-12 LAB — LIPID PANEL
Cholesterol: 178 mg/dL (ref <200)
HDL: 76 mg/dL (ref >=50)
LDL Cholesterol (Calc): 81 mg/dL (calc)
Non-HDL Cholesterol (Calc): 102 mg/dL (calc) (ref <130)
Total CHOL/HDL Ratio: 2.3 (calc) (ref <5.0)
Triglycerides: 117 mg/dL (ref <150)

## 2020-08-12 LAB — MICROALBUMIN / CREATININE URINE RATIO
Creatinine, Urine: 15 mg/dL — ABNORMAL LOW (ref 20–275)
Microalb, Ur: <0.2 mg/dL

## 2020-08-12 LAB — HEMOGLOBIN A1C
Hgb A1c MFr Bld: 8.5 % of total Hgb — ABNORMAL HIGH (ref <5.7)
Mean Plasma Glucose: 197 mg/dL
eAG (mmol/L): 10.9 mmol/L

## 2020-08-12 LAB — TSH: TSH: 0.96 mIU/L (ref 0.40–4.50)

## 2020-08-14 ENCOUNTER — Other Ambulatory Visit: Payer: Medicare Other | Admitting: Internal Medicine

## 2020-08-14 ENCOUNTER — Other Ambulatory Visit: Payer: Self-pay

## 2020-08-14 NOTE — Addendum Note (Signed)
Addended by: Gregery Na on: 08/14/2020 10:41 AM   Modules accepted: Orders

## 2020-08-15 LAB — PTH, INTACT AND CALCIUM
Calcium: 10.2 mg/dL (ref 8.6–10.4)
PTH: 30 pg/mL (ref 16–77)

## 2020-09-11 ENCOUNTER — Other Ambulatory Visit: Payer: Self-pay | Admitting: Internal Medicine

## 2020-09-12 ENCOUNTER — Other Ambulatory Visit: Payer: Self-pay | Admitting: Internal Medicine

## 2020-10-19 ENCOUNTER — Other Ambulatory Visit: Payer: Self-pay | Admitting: Internal Medicine

## 2020-11-14 ENCOUNTER — Other Ambulatory Visit: Payer: Self-pay | Admitting: Internal Medicine

## 2021-01-02 ENCOUNTER — Other Ambulatory Visit: Payer: Self-pay | Admitting: Internal Medicine

## 2021-01-22 LAB — HM DIABETES EYE EXAM

## 2021-02-02 ENCOUNTER — Other Ambulatory Visit: Payer: Self-pay

## 2021-02-02 ENCOUNTER — Other Ambulatory Visit: Payer: Medicare Other | Admitting: Internal Medicine

## 2021-02-02 ENCOUNTER — Ambulatory Visit: Payer: Medicare Other | Admitting: Internal Medicine

## 2021-02-02 DIAGNOSIS — E1169 Type 2 diabetes mellitus with other specified complication: Secondary | ICD-10-CM

## 2021-02-02 DIAGNOSIS — E119 Type 2 diabetes mellitus without complications: Secondary | ICD-10-CM

## 2021-02-02 DIAGNOSIS — I10 Essential (primary) hypertension: Secondary | ICD-10-CM

## 2021-02-02 DIAGNOSIS — E785 Hyperlipidemia, unspecified: Secondary | ICD-10-CM

## 2021-02-03 LAB — COMPLETE METABOLIC PANEL WITH GFR
AG Ratio: 1.5 (calc) (ref 1.0–2.5)
ALT: 12 U/L (ref 6–29)
AST: 14 U/L (ref 10–35)
Albumin: 4.6 g/dL (ref 3.6–5.1)
Alkaline phosphatase (APISO): 80 U/L (ref 37–153)
BUN: 15 mg/dL (ref 7–25)
CO2: 33 mmol/L — ABNORMAL HIGH (ref 20–32)
Calcium: 10.3 mg/dL (ref 8.6–10.4)
Chloride: 100 mmol/L (ref 98–110)
Creat: 0.62 mg/dL (ref 0.60–1.00)
Globulin: 3.1 g/dL (calc) (ref 1.9–3.7)
Glucose, Bld: 159 mg/dL — ABNORMAL HIGH (ref 65–99)
Potassium: 4.3 mmol/L (ref 3.5–5.3)
Sodium: 141 mmol/L (ref 135–146)
Total Bilirubin: 0.8 mg/dL (ref 0.2–1.2)
Total Protein: 7.7 g/dL (ref 6.1–8.1)
eGFR: 93 mL/min/{1.73_m2} (ref >=60)

## 2021-02-03 LAB — CBC WITH DIFFERENTIAL/PLATELET
Absolute Monocytes: 342 cells/uL (ref 200–950)
Basophils Absolute: 53 cells/uL (ref 0–200)
Basophils Relative: 0.9 %
Eosinophils Absolute: 183 cells/uL (ref 15–500)
Eosinophils Relative: 3.1 %
HCT: 42.9 % (ref 35.0–45.0)
Hemoglobin: 14.4 g/dL (ref 11.7–15.5)
Lymphs Abs: 2130 cells/uL (ref 850–3900)
MCH: 31.7 pg (ref 27.0–33.0)
MCHC: 33.6 g/dL (ref 32.0–36.0)
MCV: 94.5 fL (ref 80.0–100.0)
MPV: 10.8 fL (ref 7.5–12.5)
Monocytes Relative: 5.8 %
Neutro Abs: 3192 cells/uL (ref 1500–7800)
Neutrophils Relative %: 54.1 %
Platelets: 253 10*3/uL (ref 140–400)
RBC: 4.54 10*6/uL (ref 3.80–5.10)
RDW: 11.1 % (ref 11.0–15.0)
Total Lymphocyte: 36.1 %
WBC: 5.9 10*3/uL (ref 3.8–10.8)

## 2021-02-03 LAB — LIPID PANEL
Cholesterol: 170 mg/dL (ref <200)
HDL: 64 mg/dL (ref >=50)
LDL Cholesterol (Calc): 83 mg/dL (calc)
Non-HDL Cholesterol (Calc): 106 mg/dL (calc) (ref <130)
Total CHOL/HDL Ratio: 2.7 (calc) (ref <5.0)
Triglycerides: 126 mg/dL (ref <150)

## 2021-02-03 LAB — HEMOGLOBIN A1C
Hgb A1c MFr Bld: 8.2 % of total Hgb — ABNORMAL HIGH (ref <5.7)
Mean Plasma Glucose: 189 mg/dL
eAG (mmol/L): 10.4 mmol/L

## 2021-02-03 LAB — MICROALBUMIN / CREATININE URINE RATIO
Creatinine, Urine: 12 mg/dL — ABNORMAL LOW (ref 20–275)
Microalb, Ur: <0.2 mg/dL

## 2021-02-05 ENCOUNTER — Encounter: Payer: Self-pay | Admitting: Internal Medicine

## 2021-02-05 ENCOUNTER — Other Ambulatory Visit: Payer: Self-pay

## 2021-02-05 ENCOUNTER — Ambulatory Visit (INDEPENDENT_AMBULATORY_CARE_PROVIDER_SITE_OTHER): Payer: Medicare Other | Admitting: Internal Medicine

## 2021-02-05 VITALS — BP 128/60 | HR 76 | Temp 97.8°F | Ht 66.0 in | Wt 200.0 lb

## 2021-02-05 DIAGNOSIS — H903 Sensorineural hearing loss, bilateral: Secondary | ICD-10-CM

## 2021-02-05 DIAGNOSIS — F411 Generalized anxiety disorder: Secondary | ICD-10-CM | POA: Diagnosis not present

## 2021-02-05 DIAGNOSIS — R7309 Other abnormal glucose: Secondary | ICD-10-CM

## 2021-02-05 DIAGNOSIS — E785 Hyperlipidemia, unspecified: Secondary | ICD-10-CM

## 2021-02-05 DIAGNOSIS — E1169 Type 2 diabetes mellitus with other specified complication: Secondary | ICD-10-CM | POA: Diagnosis not present

## 2021-02-05 DIAGNOSIS — Z1213 Encounter for screening for malignant neoplasm of small intestine: Secondary | ICD-10-CM

## 2021-02-05 DIAGNOSIS — Z1211 Encounter for screening for malignant neoplasm of colon: Secondary | ICD-10-CM | POA: Diagnosis not present

## 2021-02-05 DIAGNOSIS — E8881 Metabolic syndrome: Secondary | ICD-10-CM

## 2021-02-05 DIAGNOSIS — Z8659 Personal history of other mental and behavioral disorders: Secondary | ICD-10-CM

## 2021-02-05 DIAGNOSIS — M2550 Pain in unspecified joint: Secondary | ICD-10-CM

## 2021-02-05 DIAGNOSIS — I10 Essential (primary) hypertension: Secondary | ICD-10-CM

## 2021-02-05 MED ORDER — FLUTICASONE PROPIONATE 50 MCG/ACT NA SUSP: 1.0000 | Freq: Every day | NASAL | 11 refills | Status: DC

## 2021-02-05 MED ORDER — MUPIROCIN CALCIUM 2 % EX CREA: 1.0000 "application " | TOPICAL_CREAM | Freq: Two times a day (BID) | CUTANEOUS | 0 refills | Status: DC

## 2021-02-05 NOTE — Progress Notes (Signed)
° °  Subjective:    Patient ID: Terry Wilson, female    DOB: [birth year], 75 y.o.   MRN: [id number]  HPI 75 year old Female seen for 6 month recheck.  Declines Covid and influenza vaccines.  She was seen in June for annual Medicare wellness visit.  She is hard of hearing despite wearing hearing aids.  She has a history of diabetes mellitus, metabolic syndrome, hyperlipidemia, anxiety, essential hypertension and allergic rhinitis.  History of vitamin D deficiency.  Has annual diabetic eye exam by Dr. Hazle Quant.    Review of Systems no new complaints     Objective:   Physical Exam Blood pressure 128/60 pulse 76 regular temperature 97.8 degrees pulse oximetry 96% weight 200 pounds BMI 32.28  Skin: Warm and dry.  Nodes none.  Chest is clear to auscultation without rales or wheezing.  Cardiac exam: Regular rate and rhythm without murmur or ectopy.  No lower extremity pitting edema.       Assessment & Plan:   Essential hypertension-stable on HCTZ, amlodipine and losartan  Musculoskeletal pain treated with Mobic  Anxiety treated with Klonopin twice daily  Hyperlipidemia-lipid panel stable on Crestor 10 mg daily  Type 2 diabetes mellitus-hemoglobin A1c is 8.2%.  I would like for her to be on oral medication but she has declined.  Says she will try to do better with diet and exercise.  Recheck in 6 months.  Cologuard ordered.

## 2021-02-21 NOTE — Patient Instructions (Addendum)
Cologuard ordered.  Hemoglobin A1c is 8.2% which is disappointing.  Would like to start you on oral medication.  However, you are reluctant to do that.  Continue other medications as prescribed.  Return in 6 months.  Please watch diet and try to get some exercise.

## 2021-02-23 ENCOUNTER — Other Ambulatory Visit: Payer: Self-pay | Admitting: Internal Medicine

## 2021-04-01 ENCOUNTER — Other Ambulatory Visit: Payer: Self-pay | Admitting: Internal Medicine

## 2021-05-05 ENCOUNTER — Other Ambulatory Visit: Payer: Self-pay

## 2021-05-05 ENCOUNTER — Encounter (HOSPITAL_BASED_OUTPATIENT_CLINIC_OR_DEPARTMENT_OTHER): Payer: Self-pay | Admitting: Emergency Medicine

## 2021-05-05 ENCOUNTER — Emergency Department (HOSPITAL_BASED_OUTPATIENT_CLINIC_OR_DEPARTMENT_OTHER): Payer: Medicare Other

## 2021-05-05 ENCOUNTER — Observation Stay (HOSPITAL_BASED_OUTPATIENT_CLINIC_OR_DEPARTMENT_OTHER)
Admission: EM | Admit: 2021-05-05 | Discharge: 2021-05-06 | Disposition: A | Discharge status: Home / Self Care | Payer: Medicare Other | Attending: Internal Medicine | Admitting: Internal Medicine

## 2021-05-05 DIAGNOSIS — M50221 Other cervical disc displacement at C4-C5 level: Secondary | ICD-10-CM | POA: Insufficient documentation

## 2021-05-05 DIAGNOSIS — I700 Atherosclerosis of aorta: Secondary | ICD-10-CM | POA: Diagnosis not present

## 2021-05-05 DIAGNOSIS — E119 Type 2 diabetes mellitus without complications: Secondary | ICD-10-CM | POA: Diagnosis not present

## 2021-05-05 DIAGNOSIS — I10 Essential (primary) hypertension: Secondary | ICD-10-CM | POA: Diagnosis present

## 2021-05-05 DIAGNOSIS — R0789 Other chest pain: Secondary | ICD-10-CM

## 2021-05-05 DIAGNOSIS — S199XXA Unspecified injury of neck, initial encounter: Secondary | ICD-10-CM | POA: Diagnosis present

## 2021-05-05 DIAGNOSIS — S161XXA Strain of muscle, fascia and tendon at neck level, initial encounter: Principal | ICD-10-CM

## 2021-05-05 DIAGNOSIS — S301XXA Contusion of abdominal wall, initial encounter: Secondary | ICD-10-CM | POA: Diagnosis not present

## 2021-05-05 DIAGNOSIS — Z79899 Other long term (current) drug therapy: Secondary | ICD-10-CM | POA: Diagnosis not present

## 2021-05-05 DIAGNOSIS — Z20822 Contact with and (suspected) exposure to covid-19: Secondary | ICD-10-CM | POA: Diagnosis not present

## 2021-05-05 DIAGNOSIS — R778 Other specified abnormalities of plasma proteins: Secondary | ICD-10-CM | POA: Insufficient documentation

## 2021-05-05 DIAGNOSIS — E669 Obesity, unspecified: Secondary | ICD-10-CM

## 2021-05-05 DIAGNOSIS — E785 Hyperlipidemia, unspecified: Secondary | ICD-10-CM | POA: Insufficient documentation

## 2021-05-05 DIAGNOSIS — S0990XA Unspecified injury of head, initial encounter: Secondary | ICD-10-CM | POA: Insufficient documentation

## 2021-05-05 DIAGNOSIS — S13101A Dislocation of unspecified cervical vertebrae, initial encounter: Secondary | ICD-10-CM | POA: Diagnosis present

## 2021-05-05 DIAGNOSIS — S2691XA Contusion of heart, unspecified with or without hemopericardium, initial encounter: Secondary | ICD-10-CM

## 2021-05-05 DIAGNOSIS — R7989 Other specified abnormal findings of blood chemistry: Secondary | ICD-10-CM

## 2021-05-05 DIAGNOSIS — E1169 Type 2 diabetes mellitus with other specified complication: Secondary | ICD-10-CM | POA: Diagnosis present

## 2021-05-05 LAB — CBC WITH DIFFERENTIAL/PLATELET
Abs Immature Granulocytes: 0.06 10*3/uL (ref 0.00–0.07)
Basophils Absolute: 0.1 10*3/uL (ref 0.0–0.1)
Basophils Relative: 1 %
Eosinophils Absolute: 0.1 10*3/uL (ref 0.0–0.5)
Eosinophils Relative: 1 %
HCT: 45.1 % (ref 36.0–46.0)
Hemoglobin: 14.8 g/dL (ref 12.0–15.0)
Immature Granulocytes: 0 %
Lymphocytes Relative: 20 %
Lymphs Abs: 2.9 10*3/uL (ref 0.7–4.0)
MCH: 31.1 pg (ref 26.0–34.0)
MCHC: 32.8 g/dL (ref 30.0–36.0)
MCV: 94.7 fL (ref 80.0–100.0)
Monocytes Absolute: 0.8 10*3/uL (ref 0.1–1.0)
Monocytes Relative: 6 %
Neutro Abs: 10.4 10*3/uL — ABNORMAL HIGH (ref 1.7–7.7)
Neutrophils Relative %: 72 %
Platelets: 257 10*3/uL (ref 150–400)
RBC: 4.76 MIL/uL (ref 3.87–5.11)
RDW: 11.8 % (ref 11.5–15.5)
WBC: 14.3 10*3/uL — ABNORMAL HIGH (ref 4.0–10.5)
nRBC: 0 % (ref 0.0–0.2)

## 2021-05-05 LAB — RESP PANEL BY RT-PCR (FLU A&B, COVID) ARPGX2
Influenza A by PCR: NEGATIVE
Influenza B by PCR: NEGATIVE
SARS Coronavirus 2 by RT PCR: NEGATIVE

## 2021-05-05 LAB — BASIC METABOLIC PANEL
Anion gap: 11 (ref 5–15)
BUN: 19 mg/dL (ref 8–23)
CO2: 29 mmol/L (ref 22–32)
Calcium: 9.6 mg/dL (ref 8.9–10.3)
Chloride: 98 mmol/L (ref 98–111)
Creatinine, Ser: 0.65 mg/dL (ref 0.44–1.00)
GFR, Estimated: >60 mL/min (ref >60)
Glucose, Bld: 182 mg/dL — ABNORMAL HIGH (ref 70–99)
Potassium: 4.1 mmol/L (ref 3.5–5.1)
Sodium: 138 mmol/L (ref 135–145)

## 2021-05-05 LAB — TROPONIN I (HIGH SENSITIVITY)
Troponin I (High Sensitivity): 86 ng/L — ABNORMAL HIGH (ref <18)
Troponin I (High Sensitivity): 112 ng/L (ref <18)

## 2021-05-05 MED ORDER — IOHEXOL 300 MG/ML  SOLN
100.0000 mL | Freq: Once | INTRAMUSCULAR | Status: AC | PRN
  Administered 2021-05-05: 100 mL via INTRAVENOUS

## 2021-05-05 NOTE — ED Notes (Signed)
Patient ambulated to the bathroom.
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
## 2021-05-05 NOTE — ED Notes (Signed)
With Ct 

## 2021-05-05 NOTE — ED Notes (Signed)
Back from CT

## 2021-05-06 ENCOUNTER — Observation Stay (HOSPITAL_BASED_OUTPATIENT_CLINIC_OR_DEPARTMENT_OTHER): Payer: Medicare Other

## 2021-05-06 DIAGNOSIS — S301XXA Contusion of abdominal wall, initial encounter: Secondary | ICD-10-CM | POA: Diagnosis not present

## 2021-05-06 DIAGNOSIS — S199XXA Unspecified injury of neck, initial encounter: Secondary | ICD-10-CM | POA: Diagnosis present

## 2021-05-06 DIAGNOSIS — S0990XA Unspecified injury of head, initial encounter: Secondary | ICD-10-CM | POA: Diagnosis not present

## 2021-05-06 DIAGNOSIS — R0789 Other chest pain: Secondary | ICD-10-CM | POA: Diagnosis not present

## 2021-05-06 DIAGNOSIS — Z79899 Other long term (current) drug therapy: Secondary | ICD-10-CM | POA: Diagnosis not present

## 2021-05-06 DIAGNOSIS — I10 Essential (primary) hypertension: Secondary | ICD-10-CM | POA: Diagnosis not present

## 2021-05-06 DIAGNOSIS — R778 Other specified abnormalities of plasma proteins: Secondary | ICD-10-CM | POA: Diagnosis not present

## 2021-05-06 DIAGNOSIS — S161XXA Strain of muscle, fascia and tendon at neck level, initial encounter: Secondary | ICD-10-CM | POA: Diagnosis not present

## 2021-05-06 DIAGNOSIS — I700 Atherosclerosis of aorta: Secondary | ICD-10-CM | POA: Diagnosis not present

## 2021-05-06 DIAGNOSIS — R079 Chest pain, unspecified: Secondary | ICD-10-CM

## 2021-05-06 DIAGNOSIS — Z20822 Contact with and (suspected) exposure to covid-19: Secondary | ICD-10-CM | POA: Diagnosis not present

## 2021-05-06 DIAGNOSIS — E119 Type 2 diabetes mellitus without complications: Secondary | ICD-10-CM | POA: Diagnosis not present

## 2021-05-06 DIAGNOSIS — M50221 Other cervical disc displacement at C4-C5 level: Secondary | ICD-10-CM | POA: Diagnosis not present

## 2021-05-06 DIAGNOSIS — E785 Hyperlipidemia, unspecified: Secondary | ICD-10-CM | POA: Diagnosis not present

## 2021-05-06 DIAGNOSIS — S13101A Dislocation of unspecified cervical vertebrae, initial encounter: Secondary | ICD-10-CM | POA: Diagnosis present

## 2021-05-06 LAB — CBC WITH DIFFERENTIAL/PLATELET
Abs Immature Granulocytes: 0.03 10*3/uL (ref 0.00–0.07)
Basophils Absolute: 0.1 10*3/uL (ref 0.0–0.1)
Basophils Relative: 1 %
Eosinophils Absolute: 0.1 10*3/uL (ref 0.0–0.5)
Eosinophils Relative: 1 %
HCT: 40.5 % (ref 36.0–46.0)
Hemoglobin: 13.6 g/dL (ref 12.0–15.0)
Immature Granulocytes: 0 %
Lymphocytes Relative: 27 %
Lymphs Abs: 2.4 10*3/uL (ref 0.7–4.0)
MCH: 31.8 pg (ref 26.0–34.0)
MCHC: 33.6 g/dL (ref 30.0–36.0)
MCV: 94.6 fL (ref 80.0–100.0)
Monocytes Absolute: 0.7 10*3/uL (ref 0.1–1.0)
Monocytes Relative: 8 %
Neutro Abs: 5.6 10*3/uL (ref 1.7–7.7)
Neutrophils Relative %: 63 %
Platelets: 237 10*3/uL (ref 150–400)
RBC: 4.28 MIL/uL (ref 3.87–5.11)
RDW: 11.9 % (ref 11.5–15.5)
WBC: 8.8 10*3/uL (ref 4.0–10.5)
nRBC: 0 % (ref 0.0–0.2)

## 2021-05-06 LAB — COMPREHENSIVE METABOLIC PANEL
ALT: 15 U/L (ref 0–44)
AST: 18 U/L (ref 15–41)
Albumin: 3.6 g/dL (ref 3.5–5.0)
Alkaline Phosphatase: 71 U/L (ref 38–126)
Anion gap: 10 (ref 5–15)
BUN: 12 mg/dL (ref 8–23)
CO2: 27 mmol/L (ref 22–32)
Calcium: 9.5 mg/dL (ref 8.9–10.3)
Chloride: 100 mmol/L (ref 98–111)
Creatinine, Ser: 0.78 mg/dL (ref 0.44–1.00)
GFR, Estimated: >60 mL/min (ref >60)
Glucose, Bld: 211 mg/dL — ABNORMAL HIGH (ref 70–99)
Potassium: 3.9 mmol/L (ref 3.5–5.1)
Sodium: 137 mmol/L (ref 135–145)
Total Bilirubin: 0.7 mg/dL (ref 0.3–1.2)
Total Protein: 6.9 g/dL (ref 6.5–8.1)

## 2021-05-06 LAB — ECHOCARDIOGRAM COMPLETE
Area-P 1/2: 4.8 cm2
Calc EF: 64.4 %
Height: 68 in
S' Lateral: 3.3 cm
Single Plane A2C EF: 63.4 %
Single Plane A4C EF: 67.6 %
Weight: 3256 oz

## 2021-05-06 LAB — GLUCOSE, CAPILLARY: Glucose-Capillary: 185 mg/dL — ABNORMAL HIGH (ref 70–99)

## 2021-05-06 LAB — MAGNESIUM: Magnesium: 2.1 mg/dL (ref 1.7–2.4)

## 2021-05-06 LAB — TROPONIN I (HIGH SENSITIVITY): Troponin I (High Sensitivity): 97 ng/L — ABNORMAL HIGH (ref <18)

## 2021-05-06 MED ORDER — INSULIN ASPART 100 UNIT/ML IJ SOLN: 0.0000 [IU] | Freq: Every day | INTRAMUSCULAR | Status: DC

## 2021-05-06 MED ORDER — SODIUM CHLORIDE 0.9% FLUSH
3.0000 mL | Freq: Two times a day (BID) | INTRAVENOUS | Status: DC
  Administered 2021-05-06: 3 mL via INTRAVENOUS

## 2021-05-06 MED ORDER — DOCUSATE SODIUM 100 MG PO CAPS
100.0000 mg | ORAL_CAPSULE | Freq: Two times a day (BID) | ORAL | Status: DC
  Filled 2021-05-06: qty 1

## 2021-05-06 MED ORDER — FENTANYL CITRATE PF 50 MCG/ML IJ SOSY: 25.0000 ug | PREFILLED_SYRINGE | INTRAMUSCULAR | Status: DC | PRN

## 2021-05-06 MED ORDER — MORPHINE SULFATE (PF) 2 MG/ML IV SOLN: 2.0000 mg | INTRAVENOUS | Status: DC | PRN

## 2021-05-06 MED ORDER — ACETAMINOPHEN 325 MG PO TABS: 650.0000 mg | ORAL_TABLET | Freq: Four times a day (QID) | ORAL | Status: DC | PRN

## 2021-05-06 MED ORDER — ONDANSETRON HCL 4 MG/2ML IJ SOLN
4.0000 mg | Freq: Four times a day (QID) | INTRAMUSCULAR | Status: DC | PRN
Start: 2021-05-06 — End: 2021-05-06

## 2021-05-06 MED ORDER — BISACODYL 5 MG PO TBEC: 5.0000 mg | DELAYED_RELEASE_TABLET | Freq: Every day | ORAL | Status: DC | PRN

## 2021-05-06 MED ORDER — INSULIN ASPART 100 UNIT/ML IJ SOLN: 0.0000 [IU] | Freq: Three times a day (TID) | INTRAMUSCULAR | Status: DC

## 2021-05-06 MED ORDER — NALOXONE HCL 0.4 MG/ML IJ SOLN
0.4000 mg | INTRAMUSCULAR | Status: DC | PRN
Start: 2021-05-06 — End: 2021-05-06

## 2021-05-06 MED ORDER — OXYCODONE HCL 5 MG PO TABS: 5.0000 mg | ORAL_TABLET | ORAL | Status: DC | PRN

## 2021-05-06 MED ORDER — ONDANSETRON HCL 4 MG PO TABS: 4.0000 mg | ORAL_TABLET | Freq: Four times a day (QID) | ORAL | Status: DC | PRN

## 2021-05-06 MED ORDER — HYDRALAZINE HCL 20 MG/ML IJ SOLN: 5.0000 mg | INTRAMUSCULAR | Status: DC | PRN

## 2021-05-06 MED ORDER — CLONAZEPAM 0.5 MG PO TABS
0.5000 mg | ORAL_TABLET | Freq: Two times a day (BID) | ORAL | Status: DC
  Filled 2021-05-06: qty 1

## 2021-05-06 MED ORDER — ROSUVASTATIN CALCIUM 5 MG PO TABS: 10.0000 mg | ORAL_TABLET | Freq: Every day | ORAL | Status: DC

## 2021-05-06 MED ORDER — HYDROCHLOROTHIAZIDE 25 MG PO TABS
25.0000 mg | ORAL_TABLET | Freq: Every day | ORAL | Status: DC
  Administered 2021-05-06: 25 mg via ORAL
  Filled 2021-05-06: qty 1

## 2021-05-06 MED ORDER — AMLODIPINE BESYLATE 10 MG PO TABS
10.0000 mg | ORAL_TABLET | Freq: Every day | ORAL | Status: DC
  Administered 2021-05-06: 10 mg via ORAL
  Filled 2021-05-06: qty 1

## 2021-05-06 MED ORDER — FLUTICASONE PROPIONATE 50 MCG/ACT NA SUSP
1.0000 | Freq: Every day | NASAL | Status: DC
  Filled 2021-05-06: qty 16

## 2021-05-06 MED ORDER — LOSARTAN POTASSIUM 50 MG PO TABS
100.0000 mg | ORAL_TABLET | Freq: Every day | ORAL | Status: DC
  Administered 2021-05-06: 100 mg via ORAL
  Filled 2021-05-06: qty 2

## 2021-05-06 MED ORDER — ACETAMINOPHEN 650 MG RE SUPP
650.0000 mg | Freq: Four times a day (QID) | RECTAL | Status: DC | PRN
Start: 2021-05-06 — End: 2021-05-06

## 2021-05-06 MED ORDER — POLYETHYLENE GLYCOL 3350 17 G PO PACK: 17.0000 g | PACK | Freq: Every day | ORAL | Status: DC | PRN

## 2021-05-06 MED ORDER — LORATADINE 10 MG PO TABS
10.0000 mg | ORAL_TABLET | Freq: Every day | ORAL | Status: DC
Start: 2021-05-06 — End: 2021-05-06
  Administered 2021-05-06: 10 mg via ORAL
  Filled 2021-05-06: qty 1
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
## 2021-05-06 NOTE — Assessment & Plan Note (Signed)
-  Last A1c was 8.2 ?-She reports that she is diet controlled at home but she appears to need medication ?-Cover with moderate-scale SSI for now ?-Outpatient f/u to likely start medication ?

## 2021-05-06 NOTE — Assessment & Plan Note (Signed)
-  Continue home meds - Cozaar, HCTZ, amlodipine ?

## 2021-05-06 NOTE — Assessment & Plan Note (Signed)
-   Continue home Crestor °
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
## 2021-05-06 NOTE — ED Notes (Signed)
Report given to nurse. Patient is ready for transport.  ?

## 2021-05-06 NOTE — Assessment & Plan Note (Addendum)
-  Due to concern for chest pain, troponin was checked and was elevated ?-Delta was also positive but this appears to have peaked and is now downtrending ?-Possible cardiac contusion vs. Demand ischemia ?-Echo is normal, patient feels comfortable with dc to home today ?

## 2021-05-06 NOTE — Progress Notes (Signed)
?  Echocardiogram ?2D Echocardiogram has been performed. ? ?Terry Wilson ?05/06/2021, 8:46 AM ?

## 2021-05-06 NOTE — ED Notes (Signed)
[facility] is here for transport.  

## 2021-05-06 NOTE — Progress Notes (Signed)
?  Transition of Care (TOC) Screening Note ? ? ?Patient Details  ?Name: Terry Wilson ?Date of Birth: Dec 03, 1945 ? ? ?Transition of Care (TOC) CM/SW Contact:    ?Harriet Masson, RN ?Phone Number: ?05/06/2021, 8:22 AM ? ? ? ?Transition of Care Department [facility]) has reviewed patient and no TOC needs have been identified at this time. We will continue to monitor patient advancement through interdisciplinary progression rounds. If new patient transition needs arise, please place a TOC consult. ? ? ?

## 2021-05-06 NOTE — Plan of Care (Signed)

## 2021-05-06 NOTE — ED Notes (Signed)
Patient ambulated to the bathroom.
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
## 2021-05-07 ENCOUNTER — Telehealth: Payer: Self-pay

## 2021-05-07 NOTE — Telephone Encounter (Signed)
Transition Care Management Follow-up Telephone Call ?Date of discharge and from [facility] ?How have you been since you were released from the hospital? Doing well ?Any questions or concerns? No ? ?Items Reviewed: ?Did the pt receive and understand the discharge instructions provided? Yes  ?Medications obtained and verified? Yes  ?Other?  N/a ?Any new allergies since your discharge? No  ?Dietary orders reviewed? Yes ?Do you have support at home? Yes  ? ?Home Care and Equipment/Supplies: ?Were home health services ordered? not applicable ?If so, what is the name of the agency? N/a  ?Has the agency set up a time to come to the patient's home? not applicable ?Were any new equipment or medical supplies ordered?  No ?What is the name of the medical supply agency? N/a ?Were you able to get the supplies/equipment? not applicable ?Do you have any questions related to the use of the equipment or supplies? No ? ?Functional Questionnaire: (I = Independent and D = Dependent) ?ADLs: I ? ?Bathing/Dressing- I ? ?Meal Prep- I ? ?Eating- I ? ?Maintaining continence- I ? ?Transferring/Ambulation- I ? ?Managing Meds- I ? ?Follow up appointments reviewed: ? ?PCP Hospital f/u appt confirmed? Yes  Scheduled to see Dr Lenord Fellers on 3/23 @ 12. ?Specialist Hospital f/u appt confirmed? No  Not needed ?Are transportation arrangements needed? No  ?If their condition worsens, is the pt aware to call PCP or go to the Emergency Dept.? Yes ?Was the patient provided with contact information for the PCP's office or ED? Yes ?Was to pt encouraged to call back with questions or concerns? Yes  ?

## 2021-05-07 NOTE — Telephone Encounter (Signed)
Transition Care Management Unsuccessful Follow-up Telephone Call ? ?Date of discharge and from where:  T[facility] 05/06/21 ? ?Attempts:  1st Attempt ? ?Reason for unsuccessful TCM follow-up call:  Left voice message ? ?  ?

## 2021-05-09 ENCOUNTER — Other Ambulatory Visit: Payer: Self-pay | Admitting: Internal Medicine

## 2021-05-14 ENCOUNTER — Other Ambulatory Visit: Payer: Self-pay

## 2021-05-14 ENCOUNTER — Encounter: Payer: Self-pay | Admitting: Internal Medicine

## 2021-05-14 ENCOUNTER — Ambulatory Visit (INDEPENDENT_AMBULATORY_CARE_PROVIDER_SITE_OTHER): Payer: Medicare Other | Admitting: Internal Medicine

## 2021-05-14 DIAGNOSIS — Z6833 Body mass index (BMI) 33.0-33.9, adult: Secondary | ICD-10-CM

## 2021-05-14 DIAGNOSIS — E785 Hyperlipidemia, unspecified: Secondary | ICD-10-CM

## 2021-05-14 DIAGNOSIS — Z8659 Personal history of other mental and behavioral disorders: Secondary | ICD-10-CM

## 2021-05-14 DIAGNOSIS — S2691XD Contusion of heart, unspecified with or without hemopericardium, subsequent encounter: Secondary | ICD-10-CM

## 2021-05-14 DIAGNOSIS — H903 Sensorineural hearing loss, bilateral: Secondary | ICD-10-CM | POA: Diagnosis not present

## 2021-05-14 DIAGNOSIS — E1169 Type 2 diabetes mellitus with other specified complication: Secondary | ICD-10-CM

## 2021-05-14 DIAGNOSIS — I10 Essential (primary) hypertension: Secondary | ICD-10-CM | POA: Diagnosis not present

## 2021-05-14 MED ORDER — METFORMIN HCL 500 MG PO TABS: 500.0000 mg | ORAL_TABLET | Freq: Two times a day (BID) | ORAL | 3 refills | Status: DC
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
## 2021-06-17 ENCOUNTER — Ambulatory Visit (INDEPENDENT_AMBULATORY_CARE_PROVIDER_SITE_OTHER): Payer: Medicare Other | Admitting: Internal Medicine

## 2021-06-17 ENCOUNTER — Encounter: Payer: Self-pay | Admitting: Internal Medicine

## 2021-06-17 DIAGNOSIS — I10 Essential (primary) hypertension: Secondary | ICD-10-CM | POA: Diagnosis not present
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
## 2021-06-17 NOTE — Patient Instructions (Signed)
Medication Instructions:  None Ordered At This Time.   *If you need a refill on your cardiac medications before your next appointment, please call your pharmacy*  Lab Work: None Ordered At This Time.  If you have labs (blood work) drawn today and your tests are completely normal, you will receive your results only by: MyChart Message (if you have MyChart) OR A paper copy in the mail If you have any lab test that is abnormal or we need to change your treatment, we will call you to review the results.  Testing/Procedures: None Ordered At This Time.   Follow-Up: At [facility], you and your health needs are our priority.  As part of our continuing mission to provide you with exceptional heart care, we have created designated Provider Care Teams.  These Care Teams include your primary Cardiologist (physician) and Advanced Practice Providers (APPs -  Physician Assistants and Nurse Practitioners) who all work together to provide you with the care you need, when you need it.  Your next appointment:   AS NEEDED   The format for your next appointment:   In Person  Provider:   Branch, Mary E, MD        

## 2021-06-24 ENCOUNTER — Other Ambulatory Visit: Payer: Self-pay | Admitting: Internal Medicine

## 2021-06-30 ENCOUNTER — Other Ambulatory Visit: Payer: Medicare Other

## 2021-06-30 DIAGNOSIS — E785 Hyperlipidemia, unspecified: Secondary | ICD-10-CM

## 2021-06-30 DIAGNOSIS — E119 Type 2 diabetes mellitus without complications: Secondary | ICD-10-CM

## 2021-07-01 LAB — LIPID PANEL
Cholesterol: 144 mg/dL (ref <200)
HDL: 60 mg/dL (ref >=50)
LDL Cholesterol (Calc): 66 mg/dL (calc)
Non-HDL Cholesterol (Calc): 84 mg/dL (calc) (ref <130)
Total CHOL/HDL Ratio: 2.4 (calc) (ref <5.0)
Triglycerides: 99 mg/dL (ref <150)

## 2021-07-01 LAB — HEPATIC FUNCTION PANEL
AG Ratio: 1.6 (calc) (ref 1.0–2.5)
ALT: 12 U/L (ref 6–29)
AST: 13 U/L (ref 10–35)
Albumin: 4.3 g/dL (ref 3.6–5.1)
Alkaline phosphatase (APISO): 63 U/L (ref 37–153)
Bilirubin, Direct: 0.1 mg/dL (ref 0.0–0.2)
Globulin: 2.7 g/dL (calc) (ref 1.9–3.7)
Indirect Bilirubin: 0.5 mg/dL (calc) (ref 0.2–1.2)
Total Bilirubin: 0.6 mg/dL (ref 0.2–1.2)
Total Protein: 7 g/dL (ref 6.1–8.1)

## 2021-07-01 LAB — HEMOGLOBIN A1C
Hgb A1c MFr Bld: 7.1 % of total Hgb — ABNORMAL HIGH (ref <5.7)
Mean Plasma Glucose: 157 mg/dL
eAG (mmol/L): 8.7 mmol/L

## 2021-07-01 LAB — MICROALBUMIN, URINE: Microalb, Ur: <0.2 mg/dL

## 2021-07-02 ENCOUNTER — Encounter: Payer: Self-pay | Admitting: Internal Medicine

## 2021-07-02 ENCOUNTER — Ambulatory Visit (INDEPENDENT_AMBULATORY_CARE_PROVIDER_SITE_OTHER): Payer: Medicare Other | Admitting: Internal Medicine

## 2021-07-02 VITALS — BP 112/64 | HR 75 | Temp 97.7°F | Wt 198.8 lb

## 2021-07-02 DIAGNOSIS — I10 Essential (primary) hypertension: Secondary | ICD-10-CM

## 2021-07-02 DIAGNOSIS — E119 Type 2 diabetes mellitus without complications: Secondary | ICD-10-CM

## 2021-07-02 DIAGNOSIS — R5383 Other fatigue: Secondary | ICD-10-CM | POA: Diagnosis not present

## 2021-07-02 DIAGNOSIS — H903 Sensorineural hearing loss, bilateral: Secondary | ICD-10-CM

## 2021-07-02 DIAGNOSIS — E1169 Type 2 diabetes mellitus with other specified complication: Secondary | ICD-10-CM

## 2021-07-02 DIAGNOSIS — E785 Hyperlipidemia, unspecified: Secondary | ICD-10-CM

## 2021-07-02 DIAGNOSIS — Z8659 Personal history of other mental and behavioral disorders: Secondary | ICD-10-CM

## 2021-07-02 DIAGNOSIS — M7918 Myalgia, other site: Secondary | ICD-10-CM

## 2021-07-02 NOTE — Patient Instructions (Addendum)
Will do only CBC and  Hgb AIC before CPE which has already been scheduled for June  Restart Meloxicam 15 mg daily for musculoskeletal pain to take sparingly as needed.
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
## 2021-08-03 ENCOUNTER — Other Ambulatory Visit: Payer: Self-pay | Admitting: Internal Medicine

## 2021-08-13 ENCOUNTER — Encounter: Payer: Self-pay | Admitting: Internal Medicine

## 2021-08-13 ENCOUNTER — Ambulatory Visit (INDEPENDENT_AMBULATORY_CARE_PROVIDER_SITE_OTHER): Payer: Medicare Other | Admitting: Internal Medicine

## 2021-08-13 VITALS — BP 102/60 | HR 70 | Temp 98.2°F | Ht 65.5 in | Wt 194.2 lb

## 2021-08-13 DIAGNOSIS — E1169 Type 2 diabetes mellitus with other specified complication: Secondary | ICD-10-CM

## 2021-08-13 DIAGNOSIS — I10 Essential (primary) hypertension: Secondary | ICD-10-CM | POA: Diagnosis not present

## 2021-08-13 DIAGNOSIS — Z8659 Personal history of other mental and behavioral disorders: Secondary | ICD-10-CM

## 2021-08-13 DIAGNOSIS — H903 Sensorineural hearing loss, bilateral: Secondary | ICD-10-CM

## 2021-08-13 DIAGNOSIS — J0100 Acute maxillary sinusitis, unspecified: Secondary | ICD-10-CM

## 2021-08-13 DIAGNOSIS — Z6831 Body mass index (BMI) 31.0-31.9, adult: Secondary | ICD-10-CM

## 2021-08-13 DIAGNOSIS — J301 Allergic rhinitis due to pollen: Secondary | ICD-10-CM

## 2021-08-13 DIAGNOSIS — E119 Type 2 diabetes mellitus without complications: Secondary | ICD-10-CM

## 2021-08-13 DIAGNOSIS — Z0000 Encounter for general adult medical examination without abnormal findings: Secondary | ICD-10-CM | POA: Diagnosis not present

## 2021-08-13 DIAGNOSIS — J029 Acute pharyngitis, unspecified: Secondary | ICD-10-CM | POA: Diagnosis not present

## 2021-08-13 DIAGNOSIS — E785 Hyperlipidemia, unspecified: Secondary | ICD-10-CM

## 2021-08-13 DIAGNOSIS — Z8719 Personal history of other diseases of the digestive system: Secondary | ICD-10-CM

## 2021-08-13 LAB — POCT URINALYSIS DIPSTICK
Bilirubin, UA: NEGATIVE
Blood, UA: NEGATIVE
Glucose, UA: NEGATIVE
Ketones, UA: NEGATIVE
Leukocytes, UA: NEGATIVE
Nitrite, UA: NEGATIVE
Protein, UA: NEGATIVE
Spec Grav, UA: 1.01 (ref 1.010–1.025)
Urobilinogen, UA: 0.2 E.U./dL
pH, UA: 6 (ref 5.0–8.0)

## 2021-08-13 LAB — POCT RAPID STREP A (OFFICE): Rapid Strep A Screen: NEGATIVE

## 2021-08-13 MED ORDER — AMOXICILLIN 500 MG PO CAPS
500.0000 mg | ORAL_CAPSULE | Freq: Three times a day (TID) | ORAL | 0 refills | Status: AC
Start: 2021-08-13 — End: 2021-08-23

## 2021-08-13 MED ORDER — HYDROCORTISONE (PERIANAL) 2.5 % EX CREA: 1.0000 | TOPICAL_CREAM | Freq: Two times a day (BID) | CUTANEOUS | 99 refills | Status: DC

## 2021-08-13 MED ORDER — TERCONAZOLE 0.4 % VA CREA
1.0000 | TOPICAL_CREAM | Freq: Every day | VAGINAL | 0 refills | Status: AC
Start: 2021-08-13 — End: ?

## 2021-08-13 NOTE — Patient Instructions (Addendum)
Amoxicillin 500mg  3 times a day x 7 days. Terazole 7 vaginal cream as needed. Continue current meds. HGB AIC checked today. RTC in 6 months.  Continue to work on diet exercise and weight loss.
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
## 2021-08-14 LAB — HEMOGLOBIN A1C
Hgb A1c MFr Bld: 6.4 % of total Hgb — ABNORMAL HIGH (ref <5.7)
Mean Plasma Glucose: 137 mg/dL
eAG (mmol/L): 7.6 mmol/L

## 2021-10-17 ENCOUNTER — Other Ambulatory Visit: Payer: Self-pay | Admitting: Internal Medicine

## 2021-11-01 ENCOUNTER — Other Ambulatory Visit: Payer: Self-pay | Admitting: Internal Medicine

## 2021-12-01 LAB — HM MAMMOGRAPHY

## 2021-12-07 ENCOUNTER — Encounter: Payer: Self-pay | Admitting: Internal Medicine

## 2021-12-16 ENCOUNTER — Other Ambulatory Visit: Payer: Self-pay | Admitting: Internal Medicine

## 2021-12-31 ENCOUNTER — Other Ambulatory Visit: Payer: Self-pay | Admitting: Internal Medicine

## 2022-01-11 ENCOUNTER — Encounter: Payer: Self-pay | Admitting: Internal Medicine

## 2022-01-11 LAB — HM DIABETES EYE EXAM

## 2022-02-08 ENCOUNTER — Other Ambulatory Visit: Payer: Medicare Other

## 2022-02-08 DIAGNOSIS — E8881 Metabolic syndrome: Secondary | ICD-10-CM

## 2022-02-08 DIAGNOSIS — R7309 Other abnormal glucose: Secondary | ICD-10-CM

## 2022-02-08 DIAGNOSIS — E785 Hyperlipidemia, unspecified: Secondary | ICD-10-CM

## 2022-02-09 ENCOUNTER — Encounter: Payer: Self-pay | Admitting: Internal Medicine

## 2022-02-09 ENCOUNTER — Ambulatory Visit (INDEPENDENT_AMBULATORY_CARE_PROVIDER_SITE_OTHER): Payer: Medicare Other | Admitting: Internal Medicine

## 2022-02-09 DIAGNOSIS — Z6831 Body mass index (BMI) 31.0-31.9, adult: Secondary | ICD-10-CM | POA: Diagnosis not present

## 2022-02-09 DIAGNOSIS — I10 Essential (primary) hypertension: Secondary | ICD-10-CM

## 2022-02-09 DIAGNOSIS — E8881 Metabolic syndrome: Secondary | ICD-10-CM

## 2022-02-09 DIAGNOSIS — E1169 Type 2 diabetes mellitus with other specified complication: Secondary | ICD-10-CM

## 2022-02-09 DIAGNOSIS — J301 Allergic rhinitis due to pollen: Secondary | ICD-10-CM

## 2022-02-09 DIAGNOSIS — E119 Type 2 diabetes mellitus without complications: Secondary | ICD-10-CM

## 2022-02-09 DIAGNOSIS — Z8659 Personal history of other mental and behavioral disorders: Secondary | ICD-10-CM

## 2022-02-09 DIAGNOSIS — E785 Hyperlipidemia, unspecified: Secondary | ICD-10-CM

## 2022-02-09 DIAGNOSIS — H903 Sensorineural hearing loss, bilateral: Secondary | ICD-10-CM

## 2022-02-09 LAB — MICROALBUMIN / CREATININE URINE RATIO
Creatinine, Urine: 9 mg/dL — ABNORMAL LOW (ref 20–275)
Microalb, Ur: <0.2 mg/dL

## 2022-02-09 LAB — LIPID PANEL
Cholesterol: 174 mg/dL (ref <200)
HDL: 66 mg/dL (ref >=50)
LDL Cholesterol (Calc): 89 mg/dL (calc)
Non-HDL Cholesterol (Calc): 108 mg/dL (calc) (ref <130)
Total CHOL/HDL Ratio: 2.6 (calc) (ref <5.0)
Triglycerides: 92 mg/dL (ref <150)

## 2022-02-09 LAB — HEMOGLOBIN A1C
Hgb A1c MFr Bld: 6.5 % of total Hgb — ABNORMAL HIGH (ref <5.7)
Mean Plasma Glucose: 140 mg/dL
eAG (mmol/L): 7.7 mmol/L

## 2022-02-09 LAB — HEPATIC FUNCTION PANEL
AG Ratio: 1.6 (calc) (ref 1.0–2.5)
ALT: 10 U/L (ref 6–29)
AST: 12 U/L (ref 10–35)
Albumin: 4.6 g/dL (ref 3.6–5.1)
Alkaline phosphatase (APISO): 67 U/L (ref 37–153)
Bilirubin, Direct: 0.1 mg/dL (ref 0.0–0.2)
Globulin: 2.8 g/dL (calc) (ref 1.9–3.7)
Indirect Bilirubin: 0.4 mg/dL (calc) (ref 0.2–1.2)
Total Bilirubin: 0.5 mg/dL (ref 0.2–1.2)
Total Protein: 7.4 g/dL (ref 6.1–8.1)
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
## 2022-02-17 ENCOUNTER — Other Ambulatory Visit: Payer: Self-pay | Admitting: Internal Medicine

## 2022-03-06 ENCOUNTER — Other Ambulatory Visit: Payer: Self-pay | Admitting: Internal Medicine

## 2022-03-24 NOTE — Patient Instructions (Signed)
Continue to work on diet exercise and weight loss.  Hemoglobin A1c stable at 6.5%.  Lipid panel stable and blood pressure under good control.  Follow-up for Medicare wellness and health maintenance exam in 6 months.

## 2022-05-08 ENCOUNTER — Other Ambulatory Visit: Payer: Self-pay | Admitting: Internal Medicine

## 2022-06-17 ENCOUNTER — Other Ambulatory Visit: Payer: Self-pay | Admitting: Internal Medicine

## 2022-07-02 ENCOUNTER — Other Ambulatory Visit: Payer: Self-pay | Admitting: Internal Medicine

## 2022-08-10 ENCOUNTER — Other Ambulatory Visit: Payer: Self-pay | Admitting: Internal Medicine
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
## 2022-08-16 ENCOUNTER — Other Ambulatory Visit: Payer: Medicare Other

## 2022-08-16 DIAGNOSIS — I10 Essential (primary) hypertension: Secondary | ICD-10-CM

## 2022-08-16 DIAGNOSIS — Z1329 Encounter for screening for other suspected endocrine disorder: Secondary | ICD-10-CM

## 2022-08-16 DIAGNOSIS — E785 Hyperlipidemia, unspecified: Secondary | ICD-10-CM

## 2022-08-16 DIAGNOSIS — E1169 Type 2 diabetes mellitus with other specified complication: Secondary | ICD-10-CM

## 2022-08-17 ENCOUNTER — Ambulatory Visit (INDEPENDENT_AMBULATORY_CARE_PROVIDER_SITE_OTHER): Payer: Medicare Other | Admitting: Internal Medicine

## 2022-08-17 ENCOUNTER — Encounter: Payer: Self-pay | Admitting: Internal Medicine

## 2022-08-17 VITALS — BP 130/78 | HR 71 | Temp 97.3°F | Ht 65.75 in | Wt 197.2 lb

## 2022-08-17 DIAGNOSIS — E1169 Type 2 diabetes mellitus with other specified complication: Secondary | ICD-10-CM | POA: Diagnosis not present

## 2022-08-17 DIAGNOSIS — J301 Allergic rhinitis due to pollen: Secondary | ICD-10-CM

## 2022-08-17 DIAGNOSIS — Z8659 Personal history of other mental and behavioral disorders: Secondary | ICD-10-CM

## 2022-08-17 DIAGNOSIS — Z0000 Encounter for general adult medical examination without abnormal findings: Secondary | ICD-10-CM | POA: Diagnosis not present

## 2022-08-17 DIAGNOSIS — E119 Type 2 diabetes mellitus without complications: Secondary | ICD-10-CM

## 2022-08-17 DIAGNOSIS — H903 Sensorineural hearing loss, bilateral: Secondary | ICD-10-CM

## 2022-08-17 DIAGNOSIS — F411 Generalized anxiety disorder: Secondary | ICD-10-CM | POA: Diagnosis not present

## 2022-08-17 DIAGNOSIS — E785 Hyperlipidemia, unspecified: Secondary | ICD-10-CM | POA: Diagnosis not present

## 2022-08-17 DIAGNOSIS — I10 Essential (primary) hypertension: Secondary | ICD-10-CM

## 2022-08-17 DIAGNOSIS — Z6832 Body mass index (BMI) 32.0-32.9, adult: Secondary | ICD-10-CM

## 2022-08-17 DIAGNOSIS — E8881 Metabolic syndrome: Secondary | ICD-10-CM

## 2022-08-17 DIAGNOSIS — Z7984 Long term (current) use of oral hypoglycemic drugs: Secondary | ICD-10-CM

## 2022-08-17 LAB — MICROALBUMIN / CREATININE URINE RATIO
Creatinine, Urine: 8 mg/dL — ABNORMAL LOW (ref 20–275)
Microalb Creat Ratio: 50 mg/g creat — ABNORMAL HIGH (ref <30)
Microalb, Ur: 0.4 mg/dL

## 2022-08-17 LAB — LIPID PANEL
Cholesterol: 171 mg/dL (ref <200)
HDL: 64 mg/dL (ref >=50)
LDL Cholesterol (Calc): 86 mg/dL (calc)
Non-HDL Cholesterol (Calc): 107 mg/dL (calc) (ref <130)
Total CHOL/HDL Ratio: 2.7 (calc) (ref <5.0)
Triglycerides: 110 mg/dL (ref <150)

## 2022-08-17 LAB — COMPLETE METABOLIC PANEL WITH GFR
AG Ratio: 1.6 (calc) (ref 1.0–2.5)
ALT: 12 U/L (ref 6–29)
AST: 13 U/L (ref 10–35)
Albumin: 4.5 g/dL (ref 3.6–5.1)
Alkaline phosphatase (APISO): 59 U/L (ref 37–153)
BUN: 18 mg/dL (ref 7–25)
CO2: 30 mmol/L (ref 20–32)
Calcium: 10.2 mg/dL (ref 8.6–10.4)
Chloride: 100 mmol/L (ref 98–110)
Creat: 0.65 mg/dL (ref 0.60–1.00)
Globulin: 2.8 g/dL (calc) (ref 1.9–3.7)
Glucose, Bld: 123 mg/dL — ABNORMAL HIGH (ref 65–99)
Potassium: 5.3 mmol/L (ref 3.5–5.3)
Sodium: 138 mmol/L (ref 135–146)
Total Bilirubin: 0.7 mg/dL (ref 0.2–1.2)
Total Protein: 7.3 g/dL (ref 6.1–8.1)
eGFR: 92 mL/min/{1.73_m2} (ref >=60)

## 2022-08-17 LAB — POCT URINALYSIS DIPSTICK
Bilirubin, UA: NEGATIVE
Blood, UA: NEGATIVE
Glucose, UA: NEGATIVE
Ketones, UA: NEGATIVE
Leukocytes, UA: NEGATIVE
Nitrite, UA: NEGATIVE
Protein, UA: NEGATIVE
Spec Grav, UA: <=1.005 — AB (ref 1.010–1.025)
Urobilinogen, UA: 0.2 E.U./dL
pH, UA: 7.5 (ref 5.0–8.0)

## 2022-08-17 LAB — HEMOGLOBIN A1C
Hgb A1c MFr Bld: 6.8 % of total Hgb — ABNORMAL HIGH (ref <5.7)
Mean Plasma Glucose: 148 mg/dL
eAG (mmol/L): 8.2 mmol/L

## 2022-08-17 LAB — CBC WITH DIFFERENTIAL/PLATELET
Absolute Monocytes: 429 cells/uL (ref 200–950)
Basophils Absolute: 41 cells/uL (ref 0–200)
Basophils Relative: 0.7 %
Eosinophils Absolute: 180 cells/uL (ref 15–500)
Eosinophils Relative: 3.1 %
HCT: 39.4 % (ref 35.0–45.0)
Hemoglobin: 13 g/dL (ref 11.7–15.5)
Lymphs Abs: 2140 cells/uL (ref 850–3900)
MCH: 30.8 pg (ref 27.0–33.0)
MCHC: 33 g/dL (ref 32.0–36.0)
MCV: 93.4 fL (ref 80.0–100.0)
MPV: 10.8 fL (ref 7.5–12.5)
Monocytes Relative: 7.4 %
Neutro Abs: 3010 cells/uL (ref 1500–7800)
Neutrophils Relative %: 51.9 %
Platelets: 246 10*3/uL (ref 140–400)
RBC: 4.22 10*6/uL (ref 3.80–5.10)
RDW: 11.8 % (ref 11.0–15.0)
Total Lymphocyte: 36.9 %
WBC: 5.8 10*3/uL (ref 3.8–10.8)

## 2022-08-17 LAB — TSH: TSH: 1.26 mIU/L (ref 0.40–4.50)

## 2022-08-18 NOTE — Patient Instructions (Addendum)
Continue to work on diet and exercise.  Labs are stable.  No change in medications.  Return in 6 months for follow-up.  Vaccines reviewed.  Recommend tetanus update at pharmacy.

## 2022-09-16 ENCOUNTER — Other Ambulatory Visit: Payer: Self-pay | Admitting: Internal Medicine

## 2022-10-10 ENCOUNTER — Other Ambulatory Visit: Payer: Self-pay | Admitting: Internal Medicine

## 2022-11-04 ENCOUNTER — Other Ambulatory Visit: Payer: Self-pay | Admitting: Internal Medicine

## 2022-12-06 LAB — HM MAMMOGRAPHY

## 2022-12-08 ENCOUNTER — Encounter: Payer: Self-pay | Admitting: Internal Medicine

## 2022-12-20 ENCOUNTER — Other Ambulatory Visit: Payer: Self-pay | Admitting: Internal Medicine

## 2022-12-25 ENCOUNTER — Other Ambulatory Visit: Payer: Self-pay | Admitting: Internal Medicine

## 2023-01-08 LAB — HM DIABETES EYE EXAM

## 2023-01-13 ENCOUNTER — Encounter: Payer: Self-pay | Admitting: Internal Medicine

## 2023-02-07 ENCOUNTER — Other Ambulatory Visit: Payer: Medicare Other

## 2023-02-07 DIAGNOSIS — I10 Essential (primary) hypertension: Secondary | ICD-10-CM

## 2023-02-07 DIAGNOSIS — E119 Type 2 diabetes mellitus without complications: Secondary | ICD-10-CM

## 2023-02-07 DIAGNOSIS — E1169 Type 2 diabetes mellitus with other specified complication: Secondary | ICD-10-CM

## 2023-02-08 LAB — HEPATIC FUNCTION PANEL
AG Ratio: 1.6 (calc) (ref 1.0–2.5)
ALT: 11 U/L (ref 6–29)
AST: 14 U/L (ref 10–35)
Albumin: 4.9 g/dL (ref 3.6–5.1)
Alkaline phosphatase (APISO): 72 U/L (ref 37–153)
Bilirubin, Direct: 0.1 mg/dL (ref 0.0–0.2)
Globulin: 3.1 g/dL (ref 1.9–3.7)
Indirect Bilirubin: 0.5 mg/dL (ref 0.2–1.2)
Total Bilirubin: 0.6 mg/dL (ref 0.2–1.2)
Total Protein: 8 g/dL (ref 6.1–8.1)

## 2023-02-08 LAB — LIPID PANEL
Cholesterol: 180 mg/dL (ref <200)
HDL: 71 mg/dL (ref >=50)
LDL Cholesterol (Calc): 91 mg/dL
Non-HDL Cholesterol (Calc): 109 mg/dL (ref <130)
Total CHOL/HDL Ratio: 2.5 (calc) (ref <5.0)
Triglycerides: 88 mg/dL (ref <150)

## 2023-02-08 LAB — HEMOGLOBIN A1C
Hgb A1c MFr Bld: 6.9 %{Hb} — ABNORMAL HIGH (ref <5.7)
Mean Plasma Glucose: 151 mg/dL
eAG (mmol/L): 8.4 mmol/L

## 2023-02-08 NOTE — Progress Notes (Signed)
Patient Care Team: Margaree Mackintosh, MD as PCP - General (Internal Medicine) Maisie Fus, MD as PCP - Cardiology (Cardiology)  Visit Date: 02/10/23  Subjective:    Patient ID: Terry Wilson , Female   DOB: [birth year], 77 y.o.    MRN: [id number]   77 y.o. Female presents today for 6 month follow up.   She has had sore throat, post-nasal drip recently. She has had similar episodes in the past and usually takes antibiotics for this.   History of anxiety treated with clonazepam 0.5 mg twice daily.   History of Type 2 diabetes mellitus treated with metformin 500 mg twice daily with meals. HGBA1c at 6.9% on 02/07/23, up from 6.8% on 08/16/22.   History of hyperlipidemia treated with rosuvastatin 10 mg daily at supper. Lipid panel normal.    History of hypertension treated with amlodipine 10 mg daily, losartan 100 mg daily, hydrochlorothiazide 25 mg daily. Blood pressure 110/60 today.   History of musculoskeletal pain treated with meloxicam 15 mg daily.  Past Medical History:  Diagnosis Date   Allergy    Anxiety    Bilateral hearing loss    Diabetes mellitus    Hyperlipidemia    Hypertension    Obesity      Family History  Problem Relation Age of Onset   Dementia Mother    Alcohol abuse Son     Social Hx: retired does not smoke or consume alcohol.     Review of Systems  Constitutional:  Negative for fever and malaise/fatigue.  HENT:  Positive for sore throat. Negative for congestion.   Eyes:  Negative for blurred vision.  Respiratory:  Negative for cough and shortness of breath.   Cardiovascular:  Negative for chest pain, palpitations and leg swelling.  Gastrointestinal:  Negative for vomiting.  Musculoskeletal:  Negative for back pain.  Skin:  Negative for rash.  Neurological:  Negative for loss of consciousness and headaches.        Objective:   Vitals: BP 110/60   Pulse 74   Ht 5' 5.75" (1.67 m)   Wt 199 lb (90.3 kg)   SpO2 96%   BMI 32.36 kg/m     Physical Exam Vitals and nursing note reviewed.  Constitutional:      General: She is not in acute distress.    Appearance: Normal appearance. She is not toxic-appearing.  HENT:     Head: Normocephalic and atraumatic.     Right Ear: Hearing, ear canal and external ear normal.     Left Ear: Hearing, ear canal and external ear normal.     Ears:     Comments: TMs are chronically scarred and not red.    Mouth/Throat:     Comments: Pharynx slightly injected.  Petechiae on uvula. Cardiovascular:     Rate and Rhythm: Normal rate and regular rhythm. No extrasystoles are present.    Pulses: Normal pulses.     Heart sounds: Normal heart sounds. No murmur heard.    No friction rub. No gallop.  Pulmonary:     Effort: Pulmonary effort is normal. No respiratory distress.     Breath sounds: Normal breath sounds. No wheezing or rales.  Skin:    General: Skin is warm and dry.  Neurological:     Mental Status: She is alert and oriented to person, place, and time. Mental status is at baseline.  Psychiatric:        Mood and Affect: Mood normal.  Behavior: Behavior normal.        Thought Content: Thought content normal.        Judgment: Judgment normal.       Results:   Studies obtained and personally reviewed by me:   Labs:       Component Value Date/Time   NA 138 08/16/2022 0916   K 5.3 08/16/2022 0916   CL 100 08/16/2022 0916   CO2 30 08/16/2022 0916   GLUCOSE 123 (H) 08/16/2022 0916   BUN 18 08/16/2022 0916   CREATININE 0.65 08/16/2022 0916   CALCIUM 10.2 08/16/2022 0916   PROT 8.0 02/07/2023 0936   ALBUMIN 3.6 05/06/2021 0622   AST 14 02/07/2023 0936   ALT 11 02/07/2023 0936   ALKPHOS 71 05/06/2021 0622   BILITOT 0.6 02/07/2023 0936   GFRNONAA >60 05/06/2021 0622   GFRNONAA 88 08/11/2020 1045   GFRAA 102 08/11/2020 1045     Lab Results  Component Value Date   WBC 5.8 08/16/2022   HGB 13.0 08/16/2022   HCT 39.4 08/16/2022   MCV 93.4 08/16/2022   PLT 246  08/16/2022    Lab Results  Component Value Date   CHOL 180 02/07/2023   HDL 71 02/07/2023   LDLCALC 91 02/07/2023   TRIG 88 02/07/2023   CHOLHDL 2.5 02/07/2023    Lab Results  Component Value Date   HGBA1C 6.9 (H) 02/07/2023     Lab Results  Component Value Date   TSH 1.26 08/16/2022       Assessment & Plan:   Non-strep pharyngitis: prescribed amoxicillin 500 mg three times daily. Contact us if no improvement.Rapid strep screen is negative  Anxiety: stable with clonazepam 0.5 mg twice daily.   Type 2 diabetes mellitus: treated with metformin 500 mg twice daily with meals. HGBA1c at 6.9% on 02/07/23, up from 6.8% on 08/16/22.   Hyperlipidemia: treated with rosuvastatin 10 mg daily at supper. Lipid panel normal.    Hypertension: treated with amlodipine 10 mg daily, losartan 100 mg daily, hydrochlorothiazide 25 mg daily. Blood pressure 110/60 today.   Musculoskeletal pain: treated with meloxicam 15 mg daily.  Return in 6 months for health maintenance exam or as needed.    I,Emily Lagle,acting as a Neurosurgeon for Margaree Mackintosh, MD.,have documented all relevant documentation on the behalf of Margaree Mackintosh, MD,as directed by  Margaree Mackintosh, MD while in the presence of Margaree Mackintosh, MD.   I, Margaree Mackintosh, MD, have reviewed all documentation for this visit. The documentation on 02/20/23 for the exam, diagnosis, procedures, and orders are all accurate and complete.

## 2023-02-10 ENCOUNTER — Encounter: Payer: Self-pay | Admitting: Internal Medicine

## 2023-02-10 ENCOUNTER — Ambulatory Visit: Payer: Medicare Other | Admitting: Internal Medicine

## 2023-02-10 VITALS — BP 110/60 | HR 74 | Ht 65.75 in | Wt 199.0 lb

## 2023-02-10 DIAGNOSIS — E1169 Type 2 diabetes mellitus with other specified complication: Secondary | ICD-10-CM | POA: Diagnosis not present

## 2023-02-10 DIAGNOSIS — J301 Allergic rhinitis due to pollen: Secondary | ICD-10-CM

## 2023-02-10 DIAGNOSIS — J029 Acute pharyngitis, unspecified: Secondary | ICD-10-CM

## 2023-02-10 DIAGNOSIS — H903 Sensorineural hearing loss, bilateral: Secondary | ICD-10-CM

## 2023-02-10 DIAGNOSIS — E8881 Metabolic syndrome: Secondary | ICD-10-CM

## 2023-02-10 DIAGNOSIS — Z8659 Personal history of other mental and behavioral disorders: Secondary | ICD-10-CM

## 2023-02-10 DIAGNOSIS — E785 Hyperlipidemia, unspecified: Secondary | ICD-10-CM

## 2023-02-10 DIAGNOSIS — M7918 Myalgia, other site: Secondary | ICD-10-CM

## 2023-02-10 DIAGNOSIS — I10 Essential (primary) hypertension: Secondary | ICD-10-CM | POA: Diagnosis not present

## 2023-02-10 LAB — POCT RAPID STREP A (OFFICE): Rapid Strep A Screen: NEGATIVE

## 2023-02-10 MED ORDER — AMOXICILLIN 500 MG PO CAPS: 500.0000 mg | ORAL_CAPSULE | Freq: Three times a day (TID) | ORAL | 0 refills | Status: AC

## 2023-02-11 ENCOUNTER — Ambulatory Visit: Payer: Medicare Other | Admitting: Internal Medicine

## 2023-02-20 NOTE — Patient Instructions (Addendum)
It was a pleasure to see you today.  Rapid strep screen is negative for strep pharyngitis.  Patient requesting antibiotic although this may be a viral pharyngitis.  She was prescribed at her request amoxicillin 500 mg 3 times a day for 10 days.  Hemoglobin A1c stable at 6.9%.  Continue diet and exercise efforts and continue metformin.  Lipid panel normal on rosuvastatin.  Blood pressure stable on multidrug regimen.  Continue with meloxicam for musculoskeletal pain.  Follow-up in 6 months for health maintenance exam and Medicare wellness visit.
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
## 2023-03-16 ENCOUNTER — Other Ambulatory Visit: Payer: Self-pay | Admitting: Internal Medicine

## 2023-05-19 ENCOUNTER — Other Ambulatory Visit: Payer: Self-pay | Admitting: Internal Medicine

## 2023-05-31 ENCOUNTER — Other Ambulatory Visit: Payer: Self-pay | Admitting: Internal Medicine

## 2023-05-31 ENCOUNTER — Other Ambulatory Visit: Payer: Self-pay

## 2023-06-24 ENCOUNTER — Other Ambulatory Visit: Payer: Self-pay

## 2023-06-24 ENCOUNTER — Other Ambulatory Visit: Payer: Self-pay | Admitting: Internal Medicine

## 2023-06-24 DIAGNOSIS — M7918 Myalgia, other site: Secondary | ICD-10-CM

## 2023-06-24 MED ORDER — MELOXICAM 15 MG PO TABS: 15.0000 mg | ORAL_TABLET | Freq: Every day | ORAL | 1 refills | Status: DC

## 2023-06-28 ENCOUNTER — Other Ambulatory Visit: Payer: Self-pay | Admitting: Internal Medicine

## 2023-07-28 ENCOUNTER — Ambulatory Visit: Admitting: Internal Medicine

## 2023-08-18 ENCOUNTER — Other Ambulatory Visit (INDEPENDENT_AMBULATORY_CARE_PROVIDER_SITE_OTHER): Payer: Medicare Other

## 2023-08-18 ENCOUNTER — Telehealth: Payer: Self-pay

## 2023-08-18 ENCOUNTER — Encounter: Payer: Self-pay | Admitting: Internal Medicine

## 2023-08-18 ENCOUNTER — Telehealth (INDEPENDENT_AMBULATORY_CARE_PROVIDER_SITE_OTHER): Admitting: Internal Medicine

## 2023-08-18 ENCOUNTER — Telehealth: Payer: Self-pay | Admitting: Internal Medicine

## 2023-08-18 DIAGNOSIS — E119 Type 2 diabetes mellitus without complications: Secondary | ICD-10-CM

## 2023-08-18 DIAGNOSIS — I10 Essential (primary) hypertension: Secondary | ICD-10-CM

## 2023-08-18 DIAGNOSIS — E1169 Type 2 diabetes mellitus with other specified complication: Secondary | ICD-10-CM

## 2023-08-18 DIAGNOSIS — B9620 Unspecified Escherichia coli [E. coli] as the cause of diseases classified elsewhere: Secondary | ICD-10-CM | POA: Diagnosis not present

## 2023-08-18 DIAGNOSIS — Z0289 Encounter for other administrative examinations: Secondary | ICD-10-CM

## 2023-08-18 DIAGNOSIS — Z0000 Encounter for general adult medical examination without abnormal findings: Secondary | ICD-10-CM | POA: Diagnosis not present

## 2023-08-18 DIAGNOSIS — N390 Urinary tract infection, site not specified: Secondary | ICD-10-CM | POA: Diagnosis not present

## 2023-08-18 DIAGNOSIS — R300 Dysuria: Secondary | ICD-10-CM

## 2023-08-18 LAB — POC URINALSYSI DIPSTICK (AUTOMATED)
Bilirubin, UA: NEGATIVE
Blood, UA: NEGATIVE
Glucose, UA: NEGATIVE
Ketones, UA: NEGATIVE
Nitrite, UA: NEGATIVE
Protein, UA: NEGATIVE
Spec Grav, UA: <=1.005 — AB (ref 1.010–1.025)
Urobilinogen, UA: 0.2 U/dL
pH, UA: 7 (ref 5.0–8.0)

## 2023-08-18 MED ORDER — CIPROFLOXACIN HCL 500 MG PO TABS
ORAL_TABLET | ORAL | 0 refills | Status: DC
Start: 2023-08-18 — End: 2023-08-18

## 2023-08-18 MED ORDER — FLUCONAZOLE 150 MG PO TABS: 150.0000 mg | ORAL_TABLET | Freq: Once | ORAL | 0 refills | Status: AC

## 2023-08-18 MED ORDER — CIPROFLOXACIN HCL 500 MG PO TABS: 500.0000 mg | ORAL_TABLET | Freq: Two times a day (BID) | ORAL | 0 refills | Status: AC

## 2023-08-18 NOTE — Telephone Encounter (Signed)
 I connected with Terry Wilson today by phone after learning from Staff that she had complained of urinary symptoms while here for fasting lab draw today prior to her upcoming appt with me next week on June 30th. She was  personally contacted by phone for discussion of this issue. She does not want to return to the office today as she just got home.  Therefore, I elected to proceed with a telephone visit today for this issue.   Location: She is at her home and I am at my office.   She os agreeable to visit in this format today.   She says her symptoms are mainly itching at the urethral area but she is concerned about a possible UTI. Symptoms have been ongoing for several days.She does have Type 2 Diabetes mellitus.She had an acute UTI April 2020 which was treated with Cipro . Prior to that, her last UTI was 2018.This was an E.coli UTI also treated with Cipro . She is intolerant of Sulfa, Doxycycline, and Nitrofurantoin.  Pending urine culture, she will start on Cipro  500 mg twice daily x 5 days.Will take Diflucan 150 mg one time dose as well for possible Candida infection. She has appt here next week.

## 2023-08-18 NOTE — Addendum Note (Signed)
 Addended by: BEVELY CURTISTINE PARAS on: 08/18/2023 09:38 AM   Modules accepted: Orders

## 2023-08-18 NOTE — Telephone Encounter (Signed)
 Pharmacy called and dispense quantity shows 10 pills but sig says take one by mouth for  days. Pharmacy wants to know if they should only dispense 5 pills.

## 2023-08-18 NOTE — Progress Notes (Signed)
   Subjective:    Patient ID: Terry Wilson, female    DOB: 1945/04/18, 78 y.o.   MRN: [id number]  HPI Lab only    Review of Systems     Objective:   Physical Exam        Assessment & Plan:

## 2023-08-18 NOTE — Addendum Note (Signed)
 Addended by: BEVELY CURTISTINE PARAS on: 08/18/2023 12:16 PM   Modules accepted: Orders

## 2023-08-18 NOTE — Telephone Encounter (Signed)
 Clarify order to pharmacy for pt to take Cipro  500 mg twice daily for 10 days. MJB. MD

## 2023-08-19 LAB — CBC WITH DIFFERENTIAL/PLATELET
Absolute Lymphocytes: 2164 {cells}/uL (ref 850–3900)
Absolute Monocytes: 516 {cells}/uL (ref 200–950)
Basophils Absolute: 60 {cells}/uL (ref 0–200)
Basophils Relative: 0.9 %
Eosinophils Absolute: 181 {cells}/uL (ref 15–500)
Eosinophils Relative: 2.7 %
HCT: 40.1 % (ref 35.0–45.0)
Hemoglobin: 13.3 g/dL (ref 11.7–15.5)
MCH: 31.2 pg (ref 27.0–33.0)
MCHC: 33.2 g/dL (ref 32.0–36.0)
MCV: 94.1 fL (ref 80.0–100.0)
MPV: 10.5 fL (ref 7.5–12.5)
Monocytes Relative: 7.7 %
Neutro Abs: 3779 {cells}/uL (ref 1500–7800)
Neutrophils Relative %: 56.4 %
Platelets: 255 10*3/uL (ref 140–400)
RBC: 4.26 10*6/uL (ref 3.80–5.10)
RDW: 12 % (ref 11.0–15.0)
Total Lymphocyte: 32.3 %
WBC: 6.7 10*3/uL (ref 3.8–10.8)

## 2023-08-19 LAB — COMPLETE METABOLIC PANEL WITHOUT GFR
AG Ratio: 1.6 (calc) (ref 1.0–2.5)
ALT: 12 U/L (ref 6–29)
AST: 12 U/L (ref 10–35)
Albumin: 4.5 g/dL (ref 3.6–5.1)
Alkaline phosphatase (APISO): 71 U/L (ref 37–153)
BUN: 21 mg/dL (ref 7–25)
CO2: 31 mmol/L (ref 20–32)
Calcium: 9.9 mg/dL (ref 8.6–10.4)
Chloride: 100 mmol/L (ref 98–110)
Creat: 0.69 mg/dL (ref 0.60–1.00)
Globulin: 2.9 g/dL (ref 1.9–3.7)
Glucose, Bld: 133 mg/dL — ABNORMAL HIGH (ref 65–99)
Potassium: 5 mmol/L (ref 3.5–5.3)
Sodium: 139 mmol/L (ref 135–146)
Total Bilirubin: 0.5 mg/dL (ref 0.2–1.2)
Total Protein: 7.4 g/dL (ref 6.1–8.1)

## 2023-08-19 LAB — HEMOGLOBIN A1C
Hgb A1c MFr Bld: 7.2 % — ABNORMAL HIGH (ref <5.7)
Mean Plasma Glucose: 160 mg/dL
eAG (mmol/L): 8.9 mmol/L

## 2023-08-19 LAB — LIPID PANEL
Cholesterol: 173 mg/dL (ref <200)
HDL: 63 mg/dL (ref >=50)
LDL Cholesterol (Calc): 89 mg/dL
Non-HDL Cholesterol (Calc): 110 mg/dL (ref <130)
Total CHOL/HDL Ratio: 2.7 (calc) (ref <5.0)
Triglycerides: 115 mg/dL (ref <150)

## 2023-08-19 LAB — TSH: TSH: 1.02 m[IU]/L (ref 0.40–4.50)

## 2023-08-19 LAB — MICROALBUMIN / CREATININE URINE RATIO
Creatinine, Urine: 10 mg/dL — ABNORMAL LOW (ref 20–275)
Microalb, Ur: <0.2 mg/dL

## 2023-08-20 LAB — URINE CULTURE
MICRO NUMBER:: 16628966
SPECIMEN QUALITY:: ADEQUATE

## 2023-08-22 ENCOUNTER — Ambulatory Visit: Payer: Self-pay | Admitting: Internal Medicine

## 2023-08-22 ENCOUNTER — Ambulatory Visit: Payer: Medicare Other | Admitting: Internal Medicine

## 2023-08-22 ENCOUNTER — Encounter: Payer: Self-pay | Admitting: Internal Medicine

## 2023-08-22 VITALS — BP 138/70 | HR 77 | Temp 98.0°F | Ht 67.0 in | Wt 202.4 lb

## 2023-08-22 DIAGNOSIS — I10 Essential (primary) hypertension: Secondary | ICD-10-CM

## 2023-08-22 DIAGNOSIS — B9620 Unspecified Escherichia coli [E. coli] as the cause of diseases classified elsewhere: Secondary | ICD-10-CM

## 2023-08-22 DIAGNOSIS — J301 Allergic rhinitis due to pollen: Secondary | ICD-10-CM

## 2023-08-22 DIAGNOSIS — Z13220 Encounter for screening for lipoid disorders: Secondary | ICD-10-CM

## 2023-08-22 DIAGNOSIS — E1169 Type 2 diabetes mellitus with other specified complication: Secondary | ICD-10-CM | POA: Diagnosis not present

## 2023-08-22 DIAGNOSIS — N390 Urinary tract infection, site not specified: Secondary | ICD-10-CM

## 2023-08-22 DIAGNOSIS — H903 Sensorineural hearing loss, bilateral: Secondary | ICD-10-CM | POA: Diagnosis not present

## 2023-08-22 DIAGNOSIS — E785 Hyperlipidemia, unspecified: Secondary | ICD-10-CM

## 2023-08-22 DIAGNOSIS — Z0000 Encounter for general adult medical examination without abnormal findings: Secondary | ICD-10-CM

## 2023-08-22 DIAGNOSIS — Z1329 Encounter for screening for other suspected endocrine disorder: Secondary | ICD-10-CM

## 2023-08-22 DIAGNOSIS — Z8659 Personal history of other mental and behavioral disorders: Secondary | ICD-10-CM

## 2023-08-22 DIAGNOSIS — Z6831 Body mass index (BMI) 31.0-31.9, adult: Secondary | ICD-10-CM

## 2023-08-22 LAB — POC URINALSYSI DIPSTICK (AUTOMATED)
Bilirubin, UA: NEGATIVE
Blood, UA: NEGATIVE
Glucose, UA: NEGATIVE
Ketones, UA: NEGATIVE
Leukocytes, UA: NEGATIVE
Nitrite, UA: NEGATIVE
Protein, UA: NEGATIVE
Spec Grav, UA: 1.01 (ref 1.010–1.025)
Urobilinogen, UA: 0.2 U/dL
pH, UA: 6 (ref 5.0–8.0)

## 2023-08-22 NOTE — Patient Instructions (Signed)
 It was a pleasure to see you today. Glad urinary symptoms have improved. Continue to work on diet and exercise regimen. Return January 2026. Would like to see Hgb AIC less than 7%.
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
## 2023-08-23 LAB — MICROALBUMIN / CREATININE URINE RATIO
Creatinine, Urine: 37 mg/dL (ref 20–275)
Microalb, Ur: <0.2 mg/dL

## 2023-09-21 ENCOUNTER — Ambulatory Visit (INDEPENDENT_AMBULATORY_CARE_PROVIDER_SITE_OTHER)

## 2023-09-21 VITALS — Ht 67.0 in | Wt 202.0 lb

## 2023-09-21 DIAGNOSIS — Z0000 Encounter for general adult medical examination without abnormal findings: Secondary | ICD-10-CM | POA: Diagnosis not present

## 2023-09-21 NOTE — Progress Notes (Signed)
 Subjective:   Terry Wilson is a 78 y.o. female who presents for Medicare Annual (Subsequent) preventive examination.  Visit Complete: Virtual I connected with  Devere FORBES Hoar on 09/21/23 by a audio enabled telemedicine application and verified that I am speaking with the correct person using two identifiers.  Patient Location: Home  Provider Location: Office/Clinic  I discussed the limitations of evaluation and management by telemedicine. The patient expressed understanding and agreed to proceed.  Vital Signs: Because this visit was a virtual/telehealth visit, some criteria may be missing or patient reported. Any vitals not documented were not able to be obtained and vitals that have been documented are patient reported.  Patient Medicare AWV questionnaire was completed by the patient on 09/21/23; I have confirmed that all information answered by patient is correct and no changes since this date.  Cardiac Risk Factors include: advanced age (>81men, >49 women);diabetes mellitus;dyslipidemia;hypertension;obesity (BMI >30kg/m2)     Objective:    Today's Vitals   09/21/23 0947  Weight: 202 lb (91.6 kg)  Height: 5' 7 (1.702 m)   Body mass index is 31.64 kg/m.     09/21/2023    9:58 AM 08/22/2023   11:03 AM 08/17/2022   10:00 AM 08/13/2021   10:01 AM 05/06/2021    6:10 AM 05/05/2021    6:26 PM  Advanced Directives  Does Patient Have a Medical Advance Directive? Yes Yes Yes Yes  No  Type of Estate agent of [place];Living will Living will Living will;Healthcare Power of [employer] Power of [place];Living will    Does patient want to make changes to medical advance directive?  No - Patient declined No - Patient declined No - Patient declined    Copy of Healthcare Power of Attorney in Chart? No - copy requested  No - copy requested No - copy requested    Would patient like information on creating a medical advance directive?     No - Patient declined      Current Medications (verified) Outpatient Encounter Medications as of 09/21/2023  Medication Sig   acetaminophen  (TYLENOL ) 500 MG tablet Take 500 mg by mouth at bedtime.   albuterol  (VENTOLIN  HFA) 108 (90 Base) MCG/ACT inhaler INHALE TWO PUFFS INTO LUNGS EVERY 6 HOURS AS NEEDED FOR WHEEZING   amLODipine  (NORVASC ) 10 MG tablet TAKE ONE TABLET BY MOUTH EVERY DAY   Cholecalciferol (VITAMIN D3 PO) Take 1 tablet by mouth every morning.   clonazePAM  (KLONOPIN ) 0.5 MG tablet TAKE ONE TABLET BY MOUTH TWICE DAILY   Coenzyme Q10 (COQ10 PO) Take 1 capsule by mouth daily with lunch.   FEXOFENADINE HCL PO Take 0.5 tablets by mouth 2 (two) times daily.   fluticasone  (FLONASE ) 50 MCG/ACT nasal spray Place 1 spray into both nostrils daily.   glucose blood (ONE TOUCH ULTRA TEST) test strip TEST 2 TIMES DAILY   hydrochlorothiazide  (HYDRODIURIL ) 25 MG tablet TAKE ONE TABLET BY MOUTH EVERY DAY   hydrocortisone  (ANUSOL -HC) 2.5 % rectal cream Place one application rectally 2 times a day   losartan  (COZAAR ) 100 MG tablet TAKE ONE TABLET BY MOUTH EVERY DAY   meloxicam  (MOBIC ) 15 MG tablet TAKE ONE TABLET BY MOUTH EVERY DAY   meloxicam  (MOBIC ) 15 MG tablet Take 1 tablet (15 mg total) by mouth daily.   metFORMIN  (GLUCOPHAGE ) 500 MG tablet Take 1 tablet (500 mg total) by mouth 2 (two) times daily with a meal.   Multiple Vitamins-Minerals (ZINC PO) Take 1 tablet by mouth every morning.  mupirocin  ointment (BACTROBAN ) 2 % Apply 1 application. topically 2 (two) times daily as needed (nose sores).   rosuvastatin  (CRESTOR ) 10 MG tablet TAKE ONE TABLET BY MOUTH EVERY DAY AT SUPPER (Patient taking differently: Take 10 mg by mouth daily. Every other day, per patient.)   terconazole  (TERAZOL 7 ) 0.4 % vaginal cream Place 1 applicator vaginally at bedtime.   TURMERIC PO Take 1 capsule by mouth daily with lunch. (Patient not taking: Reported on 08/22/2023)   No facility-administered encounter medications on file as of  09/21/2023.    Allergies (verified) Sulfa antibiotics, Tetanus toxoids, Nitrofurantoin monohyd macro, Shellfish allergy, Doxycycline, Other, Peach [prunus persica], and Watermelon flavoring agent (non-screening)   History: Past Medical History:  Diagnosis Date   Allergy    Anxiety    Bilateral hearing loss    Diabetes mellitus    Hyperlipidemia    Hypertension    Obesity    Past Surgical History:  Procedure Laterality Date   FRACTURE SURGERY     rt ankle, rt shoulder   TUBAL LIGATION     Family History  Problem Relation Age of Onset   Dementia Mother    Alcohol abuse Son    Social History   Socioeconomic History   Marital status: Married    Spouse name: Not on file   Number of children: Not on file   Years of education: Not on file   Highest education level: Not on file  Occupational History   Not on file  Tobacco Use   Smoking status: Never   Smokeless tobacco: Never  Vaping Use   Vaping status: Never Used  Substance and Sexual Activity   Alcohol use: No   Drug use: No   Sexual activity: Not on file  Other Topics Concern   Not on file  Social History Narrative   Not on file   Social Drivers of Health   Financial Resource Strain: Low Risk  (08/22/2023)   Overall Financial Resource Strain (CARDIA)    Difficulty of Paying Living Expenses: Not hard at all  Food Insecurity: No Food Insecurity (09/21/2023)   Hunger Vital Sign    Worried About Running Out of Food in the Last Year: Never true    Ran Out of Food in the Last Year: Never true  Transportation Needs: No Transportation Needs (09/21/2023)   PRAPARE - Administrator, Civil Service (Medical): No    Lack of Transportation (Non-Medical): No  Physical Activity: Insufficiently Active (09/21/2023)   Exercise Vital Sign    Days of Exercise per Week: 2 days    Minutes of Exercise per Session: 20 min  Stress: No Stress Concern Present (08/22/2023)   [employer] of Occupational Health -  Occupational Stress Questionnaire    Feeling of Stress: Not at all  Social Connections: Moderately Integrated (08/22/2023)   Social Connection and Isolation Panel    Frequency of Communication with Friends and Family: More than three times a week    Frequency of Social Gatherings with Friends and Family: Three times a week    Attends Religious Services: More than 4 times per year    Active Member of Clubs or Organizations: No    Attends Banker Meetings: Never    Marital Status: Married    Tobacco Counseling Counseling given: No   Clinical Intake:  Pre-visit preparation completed: Yes  Pain : No/denies pain     BMI - recorded: 31.64 Nutritional Status: BMI > 30  Obese Diabetes: Yes  How often do you need to have someone help you when you read instructions, pamphlets, or other written materials from your doctor or pharmacy?: 1 - Never  Interpreter Needed?: No  Information entered by :: Kathlynn Porto, CMA   Activities of Daily Living    09/21/2023    9:49 AM 08/22/2023   10:59 AM  In your present state of health, do you have any difficulty performing the following activities:  Hearing? 1 1  Vision? 0 0  Difficulty concentrating or making decisions? 0 0  Walking or climbing stairs? 0 0  Dressing or bathing? 0 0  Doing errands, shopping? 0 0  Preparing Food and eating ? N N  Using the Toilet? N N  In the past six months, have you accidently leaked urine? N N  Do you have problems with loss of bowel control? N N  Managing your Medications? N N  Managing your Finances? N N  Housekeeping or managing your Housekeeping? N N    Patient Care Team: Perri Ronal PARAS, MD as PCP - General (Internal Medicine) Alvan Ronal BRAVO, MD (Inactive) as PCP - Cardiology (Cardiology)  Indicate any recent Medical Services you may have received from other than [facility] providers in the past year (date may be approximate).     Assessment:   This is a routine wellness  examination for Terry Wilson.    Goals Addressed   None    Depression Screen    09/21/2023    9:57 AM 09/21/2023    9:51 AM 08/22/2023   11:04 AM 08/22/2023   10:51 AM 08/17/2022   10:01 AM 02/09/2022   10:20 AM 08/13/2021   10:00 AM  PHQ 2/9 Scores  PHQ - 2 Score 0 0 0 0 0 0 0  Exception Documentation  Patient refusal         Fall Risk    09/21/2023    9:50 AM 08/22/2023   10:51 AM 08/17/2022   10:00 AM 02/09/2022   10:19 AM 08/13/2021   10:00 AM  Fall Risk   Falls in the past year? 0 0 0 0 0  Number falls in past yr: 0 0 0 0 0  Injury with Fall? 0 0 0 0 0  Risk for fall due to : No Fall Risks No Fall Risks  No Fall Risks   Follow up Education provided;Falls evaluation completed;Falls prevention discussed Falls evaluation completed Falls prevention discussed;Falls evaluation completed Falls prevention discussed       Data saved with a previous flowsheet row definition    MEDICARE RISK AT HOME: Medicare Risk at Home Any stairs in or around the home?: No If so, are there any without handrails?: No Home free of loose throw rugs in walkways, pet beds, electrical cords, etc?: No Adequate lighting in your home to reduce risk of falls?: Yes Life alert?: No Use of a cane, walker or w/c?: No Grab bars in the bathroom?: No Shower chair or bench in shower?: Yes Elevated toilet seat or a handicapped toilet?: No  TIMED UP AND GO:  Was the test performed?  No    Cognitive Function:    08/22/2023   11:04 AM  MMSE - Mini Mental State Exam  Orientation to time 5  Orientation to Place 5  Registration 3  Attention/ Calculation 5  Recall 3  Language- name 2 objects 2  Language- repeat 1  Language- follow 3 step command 3  Language- read & follow direction 1  Write a sentence 1  Copy design 1  Total score 30        09/21/2023    9:49 AM 08/22/2023   11:05 AM 08/17/2022   10:03 AM 08/13/2021   10:02 AM  6CIT Screen  What Year? 0 points 0 points 0 points 0 points  What month? 0  points 0 points 0 points 0 points  What time? 0 points 0 points 0 points 0 points  Count back from 20 0 points 0 points 0 points 0 points  Months in reverse 0 points 0 points 0 points 0 points  Repeat phrase 0 points 0 points 4 points 0 points  Total Score 0 points 0 points 4 points 0 points    Immunizations Immunization History  Administered Date(s) Administered   Pneumococcal Polysaccharide-23 01/05/2011, 08/31/2012   Tdap 07/06/2011   Zoster, Live 10/07/2011    TDAP status: Due, Education has been provided regarding the importance of this vaccine. Advised may receive this vaccine at local pharmacy or Health Dept. Aware to provide a copy of the vaccination record if obtained from local pharmacy or Health Dept. Verbalized acceptance and understanding.  Flu Vaccine status: Due, Education has been provided regarding the importance of this vaccine. Advised may receive this vaccine at local pharmacy or Health Dept. Aware to provide a copy of the vaccination record if obtained from local pharmacy or Health Dept. Verbalized acceptance and understanding.  Pneumococcal vaccine status: Due, Education has been provided regarding the importance of this vaccine. Advised may receive this vaccine at local pharmacy or Health Dept. Aware to provide a copy of the vaccination record if obtained from local pharmacy or Health Dept. Verbalized acceptance and understanding.  Covid-19 vaccine status: Declined, Education has been provided regarding the importance of this vaccine but patient still declined. Advised may receive this vaccine at local pharmacy or Health Dept.or vaccine clinic. Aware to provide a copy of the vaccination record if obtained from local pharmacy or Health Dept. Verbalized acceptance and understanding.  Qualifies for Shingles Vaccine? Yes   Zostavax completed Yes   Shingrix Completed?: No.    Education has been provided regarding the importance of this vaccine. Patient has been advised to  call insurance company to determine out of pocket expense if they have not yet received this vaccine. Advised may also receive vaccine at local pharmacy or Health Dept. Verbalized acceptance and understanding.  Screening Tests Health Maintenance  Topic Date Due   Hepatitis C Screening  08/21/2024 (Originally 09/29/1963)   MAMMOGRAM  12/06/2023   OPHTHALMOLOGY EXAM  01/08/2024   HEMOGLOBIN A1C  02/17/2024   Diabetic kidney evaluation - eGFR measurement  08/17/2024   Diabetic kidney evaluation - Urine ACR  08/21/2024   FOOT EXAM  08/21/2024   Medicare Annual Wellness (AWV)  09/20/2024   DEXA SCAN  Completed   Hepatitis B Vaccines  Aged Out   HPV VACCINES  Aged Out   Meningococcal B Vaccine  Aged Out   DTaP/Tdap/Td  Discontinued   Pneumococcal Vaccine: 50+ Years  Discontinued   INFLUENZA VACCINE  Discontinued   Colonoscopy  Discontinued   COVID-19 Vaccine  Discontinued   Zoster Vaccines- Shingrix  Discontinued    Health Maintenance  There are no preventive care reminders to display for this patient.  Colorectal cancer screening: No longer required.   Mammogram status: Completed 12/06/2022. Repeat every year  Bone Density status: Completed 12/04/2014. Results reflect: Bone density results: NORMAL. Repeat every 2 years.  Lung Cancer Screening: (Low Dose CT Chest recommended if  Age 4-80 years, 20 pack-year currently smoking OR have quit w/in 15years.) does not qualify.   Additional Screening:  Hepatitis C Screening: does not qualify; Completed   Vision Screening: Recommended annual ophthalmology exams for early detection of glaucoma and other disorders of the eye. Is the patient up to date with their annual eye exam?  Yes  Who is the provider or what is the name of the office in which the patient attends annual eye exams?  [facility]  If pt is not established with a provider, would they like to be referred to a provider to establish care? No .   Dental Screening:  Recommended annual dental exams for proper oral hygiene  Diabetic Foot Exam: Diabetic Foot Exam: Overdue, Pt has been advised about the importance in completing this exam. Pt is scheduled for diabetic foot exam on 03/08/24.  Community Resource Referral / Chronic Care Management: CRR required this visit?  No   CCM required this visit?  No     Plan:     I have personally reviewed and noted the following in the patient's chart:   Medical and social history Use of alcohol, tobacco or illicit drugs  Current medications and supplements including opioid prescriptions. Patient is not currently taking opioid prescriptions. Functional ability and status Nutritional status Physical activity Advanced directives List of other physicians Hospitalizations, surgeries, and ER visits in previous 12 months Vitals Screenings to include cognitive, depression, and falls Referrals and appointments  In addition, I have reviewed and discussed with patient certain preventive protocols, quality metrics, and best practice recommendations. A written personalized care plan for preventive services as well as general preventive health recommendations were provided to patient.     [place] Marney Zelda, CMA   09/21/2023   After Visit Summary: (Mail) Due to this being a telephonic visit, the after visit summary with patients personalized plan was offered to patient via mail

## 2023-09-21 NOTE — Patient Instructions (Signed)
 Next appointment: Follow up in one year for your annual wellness visit    Preventive Care 65 Years and Older, Female Preventive care refers to lifestyle choices and visits with your health care provider that can promote health and wellness. What does preventive care include? A yearly physical exam. This is also called an annual well check. Dental exams once or twice a year. Routine eye exams. Ask your health care provider how often you should have your eyes checked. Personal lifestyle choices, including: Daily care of your teeth and gums. Regular physical activity. Eating a healthy diet. Avoiding tobacco and drug use. Limiting alcohol use. Practicing safe sex. Taking low-dose aspirin every day. Taking vitamin and mineral supplements as recommended by your health care provider. What happens during an annual well check? The services and screenings done by your health care provider during your annual well check will depend on your age, overall health, lifestyle risk factors, and family history of disease. Counseling  Your health care provider may ask you questions about your: Alcohol use. Tobacco use. Drug use. Emotional well-being. Home and relationship well-being. Sexual activity. Eating habits. History of falls. Memory and ability to understand (cognition). Work and work Astronomer. Reproductive health. Screening  You may have the following tests or measurements: Height, weight, and BMI. Blood pressure. Lipid and cholesterol levels. These may be checked every 5 years, or more frequently if you are over 1 years old. Skin check. Lung cancer screening. You may have this screening every year starting at age 59 if you have a 30-pack-year history of smoking and currently smoke or have quit within the past 15 years. Fecal occult blood test (FOBT) of the stool. You may have this test every year starting at age 58. Flexible sigmoidoscopy or colonoscopy. You may have a  sigmoidoscopy every 5 years or a colonoscopy every 10 years starting at age 51. Hepatitis C blood test. Hepatitis B blood test. Sexually transmitted disease (STD) testing. Diabetes screening. This is done by checking your blood sugar (glucose) after you have not eaten for a while (fasting). You may have this done every 1-3 years. Bone density scan. This is done to screen for osteoporosis. You may have this done starting at age 53. Mammogram. This may be done every 1-2 years. Talk to your health care provider about how often you should have regular mammograms. Talk with your health care provider about your test results, treatment options, and if necessary, the need for more tests. Vaccines  Your health care provider may recommend certain vaccines, such as: Influenza vaccine. This is recommended every year. Tetanus, diphtheria, and acellular pertussis (Tdap, Td) vaccine. You may need a Td booster every 10 years. Zoster vaccine. You may need this after age 55. Pneumococcal 13-valent conjugate (PCV13) vaccine. One dose is recommended after age 74. Pneumococcal polysaccharide (PPSV23) vaccine. One dose is recommended after age 92. Talk to your health care provider about which screenings and vaccines you need and how often you need them. This information is not intended to replace advice given to you by your health care provider. Make sure you discuss any questions you have with your health care provider. Document Released: 03/07/2015 Document Revised: 10/29/2015 Document Reviewed: 12/10/2014 Elsevier Interactive Patient Education  2017 [employer].  Fall Prevention in the Home Falls can cause injuries. They can happen to people of all ages. There are many things you can do to make your home safe and to help prevent falls. What can I do on the outside of  my home? Regularly fix the edges of walkways and driveways and fix any cracks. Remove anything that might make you trip as you walk through a  door, such as a raised step or threshold. Trim any bushes or trees on the path to your home. Use bright outdoor lighting. Clear any walking paths of anything that might make someone trip, such as rocks or tools. Regularly check to see if handrails are loose or broken. Make sure that both sides of any steps have handrails. Any raised decks and porches should have guardrails on the edges. Have any leaves, snow, or ice cleared regularly. Use sand or salt on walking paths during winter. Clean up any spills in your garage right away. This includes oil or grease spills. What can I do in the bathroom? Use night lights. Install grab bars by the toilet and in the tub and shower. Do not use towel bars as grab bars. Use non-skid mats or decals in the tub or shower. If you need to sit down in the shower, use a plastic, non-slip stool. Keep the floor dry. Clean up any water that spills on the floor as soon as it happens. Remove soap buildup in the tub or shower regularly. Attach bath mats securely with double-sided non-slip rug tape. Do not have throw rugs and other things on the floor that can make you trip. What can I do in the bedroom? Use night lights. Make sure that you have a light by your bed that is easy to reach. Do not use any sheets or blankets that are too big for your bed. They should not hang down onto the floor. Have a firm chair that has side arms. You can use this for support while you get dressed. Do not have throw rugs and other things on the floor that can make you trip. What can I do in the kitchen? Clean up any spills right away. Avoid walking on wet floors. Keep items that you use a lot in easy-to-reach places. If you need to reach something above you, use a strong step stool that has a grab bar. Keep electrical cords out of the way. Do not use floor polish or wax that makes floors slippery. If you must use wax, use non-skid floor wax. Do not have throw rugs and other things  on the floor that can make you trip. What can I do with my stairs? Do not leave any items on the stairs. Make sure that there are handrails on both sides of the stairs and use them. Fix handrails that are broken or loose. Make sure that handrails are as long as the stairways. Check any carpeting to make sure that it is firmly attached to the stairs. Fix any carpet that is loose or worn. Avoid having throw rugs at the top or bottom of the stairs. If you do have throw rugs, attach them to the floor with carpet tape. Make sure that you have a light switch at the top of the stairs and the bottom of the stairs. If you do not have them, ask someone to add them for you. What else can I do to help prevent falls? Wear shoes that: Do not have high heels. Have rubber bottoms. Are comfortable and fit you well. Are closed at the toe. Do not wear sandals. If you use a stepladder: Make sure that it is fully opened. Do not climb a closed stepladder. Make sure that both sides of the stepladder are locked into place. Ask  someone to hold it for you, if possible. Clearly mark and make sure that you can see: Any grab bars or handrails. First and last steps. Where the edge of each step is. Use tools that help you move around (mobility aids) if they are needed. These include: Canes. Walkers. Scooters. Crutches. Turn on the lights when you go into a dark area. Replace any light bulbs as soon as they burn out. Set up your furniture so you have a clear path. Avoid moving your furniture around. If any of your floors are uneven, fix them. If there are any pets around you, be aware of where they are. Review your medicines with your doctor. Some medicines can make you feel dizzy. This can increase your chance of falling. Ask your doctor what other things that you can do to help prevent falls. This information is not intended to replace advice given to you by your health care provider. Make sure you discuss any  questions you have with your health care provider. Document Released: 12/05/2008 Document Revised: 07/17/2015 Document Reviewed: 03/15/2014 Elsevier Interactive Patient Education  2017 [employer].

## 2023-10-05 ENCOUNTER — Other Ambulatory Visit: Payer: Self-pay | Admitting: Internal Medicine

## 2023-10-05 ENCOUNTER — Other Ambulatory Visit: Payer: Self-pay

## 2023-10-05 MED ORDER — HYDROCORTISONE (PERIANAL) 2.5 % EX CREA: TOPICAL_CREAM | CUTANEOUS | 99 refills | Status: DC

## 2023-10-08 ENCOUNTER — Other Ambulatory Visit: Payer: Self-pay | Admitting: Internal Medicine

## 2023-11-06 ENCOUNTER — Other Ambulatory Visit: Payer: Self-pay | Admitting: Internal Medicine

## 2023-11-17 ENCOUNTER — Other Ambulatory Visit: Payer: Self-pay | Admitting: Internal Medicine

## 2023-12-21 ENCOUNTER — Encounter: Payer: Self-pay | Admitting: Internal Medicine

## 2023-12-24 ENCOUNTER — Other Ambulatory Visit: Payer: Self-pay | Admitting: Internal Medicine

## 2024-01-16 ENCOUNTER — Other Ambulatory Visit: Payer: Self-pay | Admitting: Internal Medicine

## 2024-01-17 LAB — HM DIABETES EYE EXAM

## 2024-01-23 ENCOUNTER — Encounter: Payer: Self-pay | Admitting: Internal Medicine

## 2024-02-18 ENCOUNTER — Other Ambulatory Visit: Payer: Self-pay | Admitting: Internal Medicine

## 2024-02-18 DIAGNOSIS — M7918 Myalgia, other site: Secondary | ICD-10-CM

## 2024-02-20 NOTE — Telephone Encounter (Signed)
 Last refill 06/24/23 #90 + 1 refill

## 2024-02-27 NOTE — Progress Notes (Addendum)
 "   Patient Care Team: Dreon Pineda, Ronal PARAS, MD as PCP - General (Internal Medicine) Alvan Ronal BRAVO, MD (Inactive) as PCP - Cardiology (Cardiology)  Visit Date: 03/08/24  Subjective:    Patient ID: Terry Wilson , Female   DOB: [birth year], 79 y.o.    MRN: [id number]   79 y.o. Female presents today for 6 month follow up Hypertension, Hyperlipidemia,   Type 2 Diabetes mellitus, morbid obesity, allergic rhinitis. Patient also has anxiety which is longstanding and treated with Klonopin .  She says that she has a pinched nerve that causes pain on the left side of  lower back whichruns down her left leg.This sounds like sciatica. Denies numbness in her feet. She said that its hard for her to sleep because of the pain.   History of Hypertension treated with Amlodipine  10 mg daily, HCTZ 25 mg daily, and Losartan  100 mg daily. Blood Pressure elevated at 140/70.    History of Hyperlipidemia treated with Rosuvastatin  10 mg every other day.   03/05/2024 Lipid panel LDL 101, Otherwise WNL.  History of Diabetes Mellitus, Type II treated with Metformin  500 mg twice daily.  03/05/2024 HgbA1c 7.2%.  Mean plasma glucose 160. She said that glucose normally runs around 102-110. She said that she is going to get more active and watch diet more closely.  History of Arthritis; Cervical Disc Herniation; Right Ankle Fracture; Right Shoulder Fracture; Musculoskeletal Pain treated with Meloxicam  15 mg daily and Tylenol  500 mg at bedtime.    History of Anxiety treated with Klonopin  0.5 mg twice daily. Anxiety is longstanding and she does not wish to go to counseling.    Past Medical History:  Diagnosis Date   Allergy    Anxiety    Bilateral hearing loss    Diabetes mellitus    Hyperlipidemia    Hypertension    Obesity      Family History  Problem Relation Age of Onset   Dementia Mother    Alcohol abuse Son    Family history: Father died at age 51. He lost the ability to walk with history of back issues and  arthritis. He was complaining of unilateral leg pain prior to his demise and perhaps had a DVT or PE. Mother died of complications of Picks disease. 2 sisters in good health as far as is known. Son with history of alcoholism and diabetes.   Social history: She is retired. Married. 1 adult son. Husband is retired with history of prostate cancer but is doing well. 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 She does not smoke or consume alcohol.     Review of Systems  Musculoskeletal:  Positive for back pain.  All other systems reviewed and are negative.       Objective:   Vitals: BP 120/68   Pulse 88   Ht 5' 7 (1.702 m)   Wt 200 lb (90.7 kg)   SpO2 94%   BMI 31.32 kg/m    Physical Exam Vitals and nursing note reviewed.  Constitutional:      General: She is not in acute distress.    Appearance: Normal appearance. She is not toxic-appearing.  HENT:     Head: Normocephalic and atraumatic.  Neck:     Vascular: No carotid bruit.  Cardiovascular:     Rate and Rhythm: Normal rate and regular rhythm. No extrasystoles are present.    Pulses: Normal pulses.     Heart sounds: Normal heart sounds. No murmur  heard.    No friction rub. No gallop.  Pulmonary:     Effort: Pulmonary effort is normal. No respiratory distress.     Breath sounds: Normal breath sounds. No wheezing or rales.  Skin:    General: Skin is warm and dry.  Neurological:     Mental Status: She is alert and oriented to person, place, and time. Mental status is at baseline.     Deep Tendon Reflexes:     Reflex Scores:      Patellar reflexes are 2+ on the right side. Psychiatric:        Mood and Affect: Mood normal.        Behavior: Behavior normal.        Thought Content: Thought content normal.        Judgment: Judgment normal.       Results:    Labs:       Component Value Date/Time   NA 139 08/18/2023 1200   K 5.0 08/18/2023 1200   CL 100 08/18/2023  1200   CO2 31 08/18/2023 1200   GLUCOSE 133 (H) 08/18/2023 1200   BUN 21 08/18/2023 1200   CREATININE 0.69 08/18/2023 1200   CALCIUM  9.9 08/18/2023 1200   PROT 7.6 03/05/2024 0923   ALBUMIN 3.6 05/06/2021 0622   AST 13 03/05/2024 0923   ALT 12 03/05/2024 0923   ALKPHOS 71 05/06/2021 0622   BILITOT 0.6 03/05/2024 0923   GFRNONAA >60 05/06/2021 0622   GFRNONAA 88 08/11/2020 1045   GFRAA 102 08/11/2020 1045     Lab Results  Component Value Date   WBC 6.7 08/18/2023   HGB 13.3 08/18/2023   HCT 40.1 08/18/2023   MCV 94.1 08/18/2023   PLT 255 08/18/2023    Lab Results  Component Value Date   CHOL 187 03/05/2024   HDL 63 03/05/2024   LDLCALC 101 (H) 03/05/2024   TRIG 133 03/05/2024   CHOLHDL 3.0 03/05/2024    Lab Results  Component Value Date   HGBA1C 7.2 (H) 03/05/2024     Lab Results  Component Value Date   TSH 1.02 08/18/2023        Assessment & Plan:   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 Denies numbness in her feet. She said that its hard for her to sleep because of the pain.    Depo-medrol  80 mg IM injection received today. May nee to go to physical therapy.   Essential Hypertension: treated with Amlodipine  10 mg daily, HCTZ 25 mg daily, and Losartan  100 mg daily. Blood Pressure normal at 120/68.    Hyperlipidemia: treated with Rosuvastatin  10 mg every other day.   03/05/2024 Lipid panel LDL 101, Otherwise WNL.  Diabetes Mellitus, Type II: treated with Metformin  500 mg twice daily.  03/05/2024 HgbA1c 7.2%.  Mean plasma glucose 160. She said that glucose  normally runs around 102-110. She said that she is going to get more active and eat better.   Musculoskeletal pain: Cervical Disc Herniation; Right Ankle Fracture; Right Shoulder Fracture; Musculoskeletal Pain treated with Meloxicam  15 mg daily and Tylenol  500 mg at bedtime.     Anxiety: treated with Klonopin  0.5 mg twice daily. Longstanding hx of anxiety. Symptoms controlled with Klonopin . Patient not interested in counseling.   BMI 31- continue diet efforts try to walk some for exercise.  Plan: Continue  diet and exercise efforts. Flu vaccine given today. No change in medications.Return in 6 months for medicare wellness visit and annual exam.   I,Makayla C Reid,acting as a scribe for Ronal JINNY Hailstone, MD.,have documented all relevant documentation on the behalf of Ronal JINNY Hailstone, MD,as directed by  Ronal JINNY Hailstone, MD while in the presence of Ronal JINNY Hailstone, MD.      "

## 2024-03-05 ENCOUNTER — Other Ambulatory Visit: Payer: Self-pay

## 2024-03-05 DIAGNOSIS — E119 Type 2 diabetes mellitus without complications: Secondary | ICD-10-CM

## 2024-03-05 DIAGNOSIS — E1169 Type 2 diabetes mellitus with other specified complication: Secondary | ICD-10-CM

## 2024-03-06 LAB — HEPATIC FUNCTION PANEL
AG Ratio: 1.6 (calc) (ref 1.0–2.5)
ALT: 12 U/L (ref 6–29)
AST: 13 U/L (ref 10–35)
Albumin: 4.7 g/dL (ref 3.6–5.1)
Alkaline phosphatase (APISO): 84 U/L (ref 37–153)
Bilirubin, Direct: 0.1 mg/dL (ref 0.0–0.2)
Globulin: 2.9 g/dL (ref 1.9–3.7)
Indirect Bilirubin: 0.5 mg/dL (ref 0.2–1.2)
Total Bilirubin: 0.6 mg/dL (ref 0.2–1.2)
Total Protein: 7.6 g/dL (ref 6.1–8.1)

## 2024-03-06 LAB — LIPID PANEL
Cholesterol: 187 mg/dL
HDL: 63 mg/dL
LDL Cholesterol (Calc): 101 mg/dL — ABNORMAL HIGH
Non-HDL Cholesterol (Calc): 124 mg/dL
Total CHOL/HDL Ratio: 3 (calc)
Triglycerides: 133 mg/dL

## 2024-03-06 LAB — HEMOGLOBIN A1C
Hgb A1c MFr Bld: 7.2 % — ABNORMAL HIGH
Mean Plasma Glucose: 160 mg/dL
eAG (mmol/L): 8.9 mmol/L

## 2024-03-08 ENCOUNTER — Encounter: Payer: Self-pay | Admitting: Internal Medicine

## 2024-03-08 ENCOUNTER — Ambulatory Visit: Payer: Self-pay | Admitting: Internal Medicine

## 2024-03-08 VITALS — BP 120/68 | HR 88 | Ht 67.0 in | Wt 200.0 lb

## 2024-03-08 DIAGNOSIS — E785 Hyperlipidemia, unspecified: Secondary | ICD-10-CM | POA: Diagnosis not present

## 2024-03-08 DIAGNOSIS — I10 Essential (primary) hypertension: Secondary | ICD-10-CM | POA: Diagnosis not present

## 2024-03-08 DIAGNOSIS — Z7984 Long term (current) use of oral hypoglycemic drugs: Secondary | ICD-10-CM

## 2024-03-08 DIAGNOSIS — M545 Low back pain, unspecified: Secondary | ICD-10-CM | POA: Diagnosis not present

## 2024-03-08 DIAGNOSIS — E1169 Type 2 diabetes mellitus with other specified complication: Secondary | ICD-10-CM

## 2024-03-08 DIAGNOSIS — M5416 Radiculopathy, lumbar region: Secondary | ICD-10-CM | POA: Diagnosis not present

## 2024-03-08 DIAGNOSIS — M5432 Sciatica, left side: Secondary | ICD-10-CM

## 2024-03-08 DIAGNOSIS — E119 Type 2 diabetes mellitus without complications: Secondary | ICD-10-CM | POA: Diagnosis not present

## 2024-03-08 DIAGNOSIS — Z6831 Body mass index (BMI) 31.0-31.9, adult: Secondary | ICD-10-CM

## 2024-03-08 DIAGNOSIS — H903 Sensorineural hearing loss, bilateral: Secondary | ICD-10-CM

## 2024-03-08 DIAGNOSIS — M7918 Myalgia, other site: Secondary | ICD-10-CM

## 2024-03-08 DIAGNOSIS — F419 Anxiety disorder, unspecified: Secondary | ICD-10-CM | POA: Diagnosis not present

## 2024-03-08 DIAGNOSIS — Z23 Encounter for immunization: Secondary | ICD-10-CM

## 2024-03-08 MED ORDER — METHYLPREDNISOLONE ACETATE 80 MG/ML IJ SUSP
80.0000 mg | Freq: Once | INTRAMUSCULAR | Status: AC
  Administered 2024-03-08: 80 mg via INTRAMUSCULAR

## 2024-03-08 NOTE — Patient Instructions (Addendum)
 Please work on diet exercise and weight loss.  Flu vaccine given.  Patient requesting Depo-Medrol  for sciatica.  Discussion regarding GLP-1 medications which would require endocrine referral.  Patient wants to continue diet and exercise efforts at home and continue same medication.  Follow-up in 6 months.Would like to see improvement in Hgb AIC. Lipid panel is stable on medication. May need to go to PT for left sided sciatica

## 2024-03-29 ENCOUNTER — Other Ambulatory Visit: Payer: Self-pay | Admitting: Internal Medicine

## 2024-08-28 ENCOUNTER — Other Ambulatory Visit

## 2024-08-30 ENCOUNTER — Ambulatory Visit: Admitting: Internal Medicine
